# Patient Record
Sex: Male | Born: 2000 | Race: Black or African American | Hispanic: No | Marital: Single | Smoking: Current every day smoker
Health system: Southern US, Community
[De-identification: ages and names within clinical notes are randomized; demographics above are authoritative.]

## PROBLEM LIST (undated history)

## (undated) DIAGNOSIS — I10 Essential (primary) hypertension: Secondary | ICD-10-CM

## (undated) DIAGNOSIS — J45909 Unspecified asthma, uncomplicated: Secondary | ICD-10-CM

## (undated) DIAGNOSIS — S02609A Fracture of mandible, unspecified, initial encounter for closed fracture: Secondary | ICD-10-CM

## (undated) DIAGNOSIS — G43909 Migraine, unspecified, not intractable, without status migrainosus: Secondary | ICD-10-CM

## (undated) HISTORY — DX: Essential (primary) hypertension: I10

## (undated) HISTORY — PX: ABDOMINAL SURGERY: SHX537

## (undated) HISTORY — PX: MANDIBLE SURGERY: SHX707

## (undated) HISTORY — PX: COLOSTOMY: SHX63

---
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
## 2018-11-06 ENCOUNTER — Encounter (HOSPITAL_COMMUNITY): Payer: Self-pay | Admitting: Emergency Medicine

## 2018-11-06 ENCOUNTER — Emergency Department (HOSPITAL_COMMUNITY): Payer: Self-pay

## 2018-11-06 ENCOUNTER — Emergency Department (HOSPITAL_COMMUNITY)
Admission: EM | Admit: 2018-11-06 | Discharge: 2018-11-06 | Discharge status: Against Medical Advice | Payer: Self-pay | Attending: Emergency Medicine | Admitting: Emergency Medicine

## 2018-11-06 ENCOUNTER — Emergency Department (HOSPITAL_COMMUNITY)
Admission: EM | Admit: 2018-11-06 | Discharge: 2018-11-06 | Disposition: A | Discharge status: Home / Self Care | Payer: Self-pay | Attending: Emergency Medicine | Admitting: Emergency Medicine

## 2018-11-06 ENCOUNTER — Other Ambulatory Visit: Payer: Self-pay

## 2018-11-06 ENCOUNTER — Encounter (HOSPITAL_COMMUNITY): Payer: Self-pay

## 2018-11-06 DIAGNOSIS — Y929 Unspecified place or not applicable: Secondary | ICD-10-CM | POA: Insufficient documentation

## 2018-11-06 DIAGNOSIS — Y999 Unspecified external cause status: Secondary | ICD-10-CM | POA: Insufficient documentation

## 2018-11-06 DIAGNOSIS — Y939 Activity, unspecified: Secondary | ICD-10-CM | POA: Insufficient documentation

## 2018-11-06 DIAGNOSIS — Z5321 Procedure and treatment not carried out due to patient leaving prior to being seen by health care provider: Secondary | ICD-10-CM | POA: Insufficient documentation

## 2018-11-06 DIAGNOSIS — S62339A Displaced fracture of neck of unspecified metacarpal bone, initial encounter for closed fracture: Secondary | ICD-10-CM | POA: Insufficient documentation

## 2018-11-06 DIAGNOSIS — F17210 Nicotine dependence, cigarettes, uncomplicated: Secondary | ICD-10-CM | POA: Insufficient documentation

## 2018-11-06 DIAGNOSIS — W2209XA Striking against other stationary object, initial encounter: Secondary | ICD-10-CM | POA: Insufficient documentation

## 2018-11-06 NOTE — Discharge Instructions (Addendum)
Use Tylenol or Motrin as directed for pain 

## 2018-11-06 NOTE — ED Triage Notes (Signed)
Pt reports he was play fighting and punched the wall. Reports rt hand injury. Swelling noted. Pain 6/10. Ice pack applied in triage.

## 2018-11-06 NOTE — ED Triage Notes (Signed)
Patient states he was play boxing and hit a brick wall with his right hand 2 days ago. Patient c/o pain and swelling to the right hand.

## 2018-11-06 NOTE — ED Provider Notes (Signed)
[facility]-EMERGENCY DEPT Provider Note   CSN: [id number] Arrival date & time: 11/06/18  1800    History   Chief Complaint Chief Complaint  Patient presents with  . Hand Injury    HPI [name] [name] is a 18 y.o. male.     18 year old-male complaining of right hand pain after punching a wall 2 days ago.  Pain is localized to the fifth metacarpal.  No distal numbness or tingling to the fifth digit.  Pain is worse with movement better with remaining still.  No treatment use prior to arrival     Past Medical History:  Diagnosis Date  . Migraine     Patient Active Problem List   Diagnosis Date Noted  . 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 COLOSTOMY    . COLOSTOMY    . ORCHIOPEXY Bilateral 05/26/2014   Procedure: Left scrotal exploration for torsion and detorsion;  Surgeon: Judie PetitM. Leonia CoronaShuaib Farooqui, MD;  Location: [facility] OR;  Service: Pediatrics;  Laterality: Bilateral;        Home Medications    Prior to Admission medications   Medication Sig Start Date End Date Taking? Authorizing Provider  HYDROcodone-acetaminophen (NORCO/VICODIN) 5-325 MG per tablet Take 1-1.5 tablets by mouth every 6 (six) hours as needed for moderate pain or severe pain. 05/27/14   Leonia CoronaFarooqui, Shuaib, MD    Family History Family History  Problem Relation Age of Onset  . Diabetes Father     Social History Social History   Tobacco Use  . Smoking status: Current Every Day Smoker    Packs/day: 0.50    Types: Cigarettes  . Smokeless tobacco: Never Used  Substance Use Topics  . Alcohol use: No  . Drug use: No     Allergies   Patient has no known allergies.   Review of Systems Review of Systems  All other systems reviewed and are negative.    Physical Exam Updated Vital Signs BP (!) 142/106 (BP Location: Right Arm)   Pulse 91   Temp 97.9 F (36.6 C) (Oral)   Resp 16   Ht 1.88  m (6\' 2" )   Wt 72.5 kg   SpO2 100%   BMI 20.52 kg/m   Physical Exam Vitals signs and nursing note reviewed.  Constitutional:      Appearance: He is well-developed. He is not toxic-appearing.  HENT:     Head: Normocephalic and atraumatic.  Eyes:     Conjunctiva/sclera: Conjunctivae normal.     Pupils: Pupils are equal, round, and reactive to light.  Neck:     Musculoskeletal: Normal range of motion.  Cardiovascular:     Rate and Rhythm: Normal rate.  Pulmonary:     Effort: Pulmonary effort is normal.  Musculoskeletal:       Hands:  Skin:    General: Skin is warm and dry.  Neurological:     Mental Status: He is alert and oriented to person, place, and time.      ED Treatments / Results  Labs (all labs ordered are listed, but only abnormal results are displayed) Labs Reviewed - No data to display  EKG None  Radiology No results found.  Procedures Procedures (including critical care time)  Medications Ordered in ED Medications - No data to display   Initial Impression / Assessment and Plan / ED Course  I have reviewed the triage vital signs and the nursing notes.  Pertinent labs & imaging results that were available during my care of the patient were reviewed by me and considered in my medical decision making (see chart for details).        With evidence of boxer's fracture on x-ray.  Will place in splint and give him referral  Final Clinical Impressions(s) / ED Diagnoses   Final diagnoses:  None    ED Discharge Orders    None       Lorre Nick, MD 11/06/18 1844

## 2019-10-13 ENCOUNTER — Other Ambulatory Visit: Payer: Self-pay

## 2019-10-13 ENCOUNTER — Emergency Department (HOSPITAL_COMMUNITY)
Admission: EM | Admit: 2019-10-13 | Discharge: 2019-10-13 | Disposition: A | Discharge status: Home / Self Care | Payer: Medicaid Other | Attending: Emergency Medicine | Admitting: Emergency Medicine

## 2019-10-13 ENCOUNTER — Emergency Department (HOSPITAL_COMMUNITY): Payer: Medicaid Other

## 2019-10-13 ENCOUNTER — Encounter (HOSPITAL_COMMUNITY): Payer: Self-pay | Admitting: Emergency Medicine

## 2019-10-13 DIAGNOSIS — T1490XA Injury, unspecified, initial encounter: Secondary | ICD-10-CM

## 2019-10-13 DIAGNOSIS — Z79899 Other long term (current) drug therapy: Secondary | ICD-10-CM | POA: Insufficient documentation

## 2019-10-13 DIAGNOSIS — F17210 Nicotine dependence, cigarettes, uncomplicated: Secondary | ICD-10-CM | POA: Insufficient documentation

## 2019-10-13 DIAGNOSIS — L03119 Cellulitis of unspecified part of limb: Secondary | ICD-10-CM

## 2019-10-13 DIAGNOSIS — L03116 Cellulitis of left lower limb: Secondary | ICD-10-CM | POA: Insufficient documentation

## 2019-10-13 MED ORDER — DOXYCYCLINE HYCLATE 100 MG PO CAPS: 100.0000 mg | ORAL_CAPSULE | Freq: Two times a day (BID) | ORAL | 0 refills | Status: AC

## 2019-10-13 NOTE — ED Triage Notes (Signed)
Patient is complaining of left foot pain on the top of his foot. Patient top of his foot is reddened.

## 2019-10-13 NOTE — Discharge Instructions (Signed)
Take antibiotics as directed. Please take all of your antibiotics until finished.  You can take Tylenol or Ibuprofen as directed for pain. You can alternate Tylenol and Ibuprofen every 4 hours. If you take Tylenol at 1pm, then you can take Ibuprofen at 5pm. Then you can take Tylenol again at 9pm.   As we discussed, if the redness or swelling starts except for adding out of the marked area or you start having fever, worsening pain, inability to walk or swelling starts extending up her leg into her calf, return emergency department for further evaluation.

## 2019-10-13 NOTE — ED Provider Notes (Signed)
[name] DEPT Provider Note   CSN: [id number] Arrival date & time: 10/13/19  1856     History Chief Complaint  Patient presents with  . Foot Injury    [name] [name] is a 19 y.o. male past medical history of migraines who presents for evaluation of 3 days of of redness, swelling noted to the bursal aspect of his left foot.  No preceding trauma, injury.  He states it is gotten slightly more red, swollen.  He has not taken anything for pain.  He is used ice with no improvement in symptoms.  He has not any fevers and denies any numbness/weakness.  He does have history of eczema that affects his feet. He denies any recent immobilization, prior history of DVT/PE, recent surgery, leg swelling, or long travel.  He does report that he smokes cigarettes and marijuana.   The history is provided by the patient.       Past Medical History:  Diagnosis Date  . Migraine     Patient Active Problem List   Diagnosis Date Noted  . 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 COLOSTOMY    . COLOSTOMY    . ORCHIOPEXY Bilateral 05/26/2014   Procedure: Left scrotal exploration for torsion and detorsion;  Surgeon: Jerilynn Mages. Gerald Stabs, MD;  Location: [facility];  Service: Pediatrics;  Laterality: Bilateral;       Family History  Problem Relation Age of Onset  . Diabetes Father     Social History   Tobacco Use  . Smoking status: Current Every Day Smoker    Packs/day: 0.50    Types: Cigarettes  . Smokeless tobacco: Never Used  Substance Use Topics  . Alcohol use: No  . Drug use: No    Home Medications Prior to Admission medications   Medication Sig Start Date End Date Taking? Authorizing Provider  doxycycline (VIBRAMYCIN) 100 MG capsule Take 1 capsule (100 mg total) by mouth 2 (two) times daily for 7 days. 10/13/19 10/20/19  Volanda Napoleon, PA-C  HYDROcodone-acetaminophen  (NORCO/VICODIN) 5-325 MG per tablet Take 1-1.5 tablets by mouth every 6 (six) hours as needed for moderate pain or severe pain. 05/27/14   Gerald Stabs, MD    Allergies    Patient has no known allergies.  Review of Systems   Review of Systems  Constitutional: Negative for fever.  Musculoskeletal:       Foot pain  Skin: Positive for color change.  Neurological: Negative for weakness and numbness.  All other systems reviewed and are negative.   Physical Exam Updated Vital Signs BP 126/85 (BP Location: Left Arm)   Pulse 90   Temp 98.5 F (36.9 C) (Oral)   Resp 18   Ht 6\' 2"  (1.88 m)   Wt 72.6 kg   SpO2 99%   BMI 20.54 kg/m   Physical Exam Vitals and nursing note reviewed.  Constitutional:      Appearance: He is well-developed.  HENT:     Head: Normocephalic and atraumatic.  Eyes:     General: No scleral icterus.       Right eye: No discharge.        Left eye: No discharge.     Conjunctiva/sclera: Conjunctivae normal.  Cardiovascular:     Pulses:          Dorsalis pedis pulses are 2+ on the right side and 2+ on the left side.  Pulmonary:     Effort: Pulmonary effort is normal.  Musculoskeletal:     Comments: Tenderness palpation of dorsal aspect of the left foot.  Flexion/tension of the ankle intact any difficulty.  He can wiggle all 5 digits on difficulty.  No bony tenderness noted to the ankle, tib-fib.  No calf tenderness palpation noted.  Bilateral lower extremities are without edema, erythema.  They are symmetric in appearance.  Skin:    General: Skin is warm and dry.     Capillary Refill: Capillary refill takes less than 2 seconds.     Comments: Good distal cap refill.  LLE is not dusky in appearance or cool to touch.  He has a 7 x 8 cm area of erythema, warmth noted to the dorsal aspect of his foot that does not cross over the ankle joint line.  He has scattered, dry skin noted to the dorsal aspects of both feet consistent with eczema.  Neurological:      Mental Status: He is alert.  Psychiatric:        Speech: Speech normal.        Behavior: Behavior normal.     ED Results / Procedures / Treatments   Labs (all labs ordered are listed, but only abnormal results are displayed) Labs Reviewed - No data to display  EKG None  Radiology DG Foot Complete Left  Result Date: 10/13/2019 CLINICAL DATA:  Pain and redness EXAM: LEFT FOOT - COMPLETE 3+ VIEW COMPARISON:  None. FINDINGS: Frontal, oblique, and lateral views were obtained. No fracture or dislocation. Joint spaces appear normal. No erosive change or bony destruction. IMPRESSION: No fracture or dislocation.  No evident arthropathy. Electronically Signed   By: Bretta Bang III M.D.   On: 10/13/2019 19:41    Procedures Procedures (including critical care time)  Medications Ordered in ED Medications - No data to display  ED Course  I have reviewed the triage vital signs and the nursing notes.  Pertinent labs & imaging results that were available during my care of the patient were reviewed by me and considered in my medical decision making (see chart for details).    MDM Rules/Calculators/A&P                      19 year old male who presents for evaluation of pain, redness, swelling to left foot x3 days.  No fever, injury.  No numbness/weakness.  No DVT risk factors. Patient is afebrile, non-toxic appearing, sitting comfortably on examination table. Vital signs reviewed and stable.  Patient is neurovascularly intact.  On exam, he has a 7 x 8 cm area of erythema, warmth noted dorsal aspect of his foot.  Consider cellulitis.  He does have extensive eczema noted dorsal aspect of foot so question if he was scratching it because a little superficial skin infection.  History/physical exam not concerning for septic arthritis, ischemic limb, DVT.  The area was marked with a skin marker.  X-ray reviewed.  Negative for any acute bony abnormalities.  I discussed results with patient.  We  will plan to start him on antibiotics for cellulitis.  Patient with no known drug allergies.  I did mark the area with a skin marker and told him that if it starts spreading outside of the designated area, he is to return for further evaluation. At this time, patient exhibits no emergent life-threatening condition that require further evaluation in ED or admission. Patient had ample opportunity for questions and discussion. All patient's questions  were answered with full understanding. Strict return precautions discussed. Patient expresses understanding and agreement to plan.   Portions of this note were generated with Scientist, clinical (histocompatibility and immunogenetics). Dictation errors may occur despite best attempts at proofreading.  Final Clinical Impression(s) / ED Diagnoses Final diagnoses:  Injury  Cellulitis of foot    Rx / DC Orders ED Discharge Orders         Ordered    doxycycline (VIBRAMYCIN) 100 MG capsule  2 times daily     10/13/19 1959           Rosana Hoes 10/13/19 2342    Gerhard Munch, MD 10/14/19 [phone]

## 2020-01-08 ENCOUNTER — Emergency Department (HOSPITAL_COMMUNITY): Payer: Self-pay

## 2020-01-08 ENCOUNTER — Encounter (HOSPITAL_COMMUNITY): Payer: Self-pay | Admitting: Emergency Medicine

## 2020-01-08 ENCOUNTER — Other Ambulatory Visit: Payer: Self-pay

## 2020-01-08 ENCOUNTER — Emergency Department (HOSPITAL_COMMUNITY)
Admission: EM | Admit: 2020-01-08 | Discharge: 2020-01-08 | Disposition: A | Discharge status: Home / Self Care | Payer: Self-pay | Attending: Emergency Medicine | Admitting: Emergency Medicine

## 2020-01-08 DIAGNOSIS — W010XXA Fall on same level from slipping, tripping and stumbling without subsequent striking against object, initial encounter: Secondary | ICD-10-CM | POA: Insufficient documentation

## 2020-01-08 DIAGNOSIS — Y9239 Other specified sports and athletic area as the place of occurrence of the external cause: Secondary | ICD-10-CM | POA: Insufficient documentation

## 2020-01-08 DIAGNOSIS — S80212A Abrasion, left knee, initial encounter: Secondary | ICD-10-CM | POA: Insufficient documentation

## 2020-01-08 DIAGNOSIS — Z20822 Contact with and (suspected) exposure to covid-19: Secondary | ICD-10-CM | POA: Insufficient documentation

## 2020-01-08 DIAGNOSIS — Y939 Activity, unspecified: Secondary | ICD-10-CM | POA: Insufficient documentation

## 2020-01-08 DIAGNOSIS — S02609A Fracture of mandible, unspecified, initial encounter for closed fracture: Secondary | ICD-10-CM | POA: Insufficient documentation

## 2020-01-08 DIAGNOSIS — S40212A Abrasion of left shoulder, initial encounter: Secondary | ICD-10-CM | POA: Insufficient documentation

## 2020-01-08 DIAGNOSIS — Z23 Encounter for immunization: Secondary | ICD-10-CM | POA: Insufficient documentation

## 2020-01-08 DIAGNOSIS — F17210 Nicotine dependence, cigarettes, uncomplicated: Secondary | ICD-10-CM | POA: Insufficient documentation

## 2020-01-08 DIAGNOSIS — Y999 Unspecified external cause status: Secondary | ICD-10-CM | POA: Insufficient documentation

## 2020-01-08 LAB — SARS CORONAVIRUS 2 BY RT PCR (HOSPITAL ORDER, PERFORMED IN ~~LOC~~ HOSPITAL LAB): SARS Coronavirus 2: NEGATIVE

## 2020-01-08 LAB — CBC WITH DIFFERENTIAL/PLATELET
Abs Immature Granulocytes: 0.05 10*3/uL (ref 0.00–0.07)
Basophils Absolute: 0.1 10*3/uL (ref 0.0–0.1)
Basophils Relative: 0 %
Eosinophils Absolute: 0 10*3/uL (ref 0.0–0.5)
Eosinophils Relative: 0 %
HCT: 48.9 % (ref 39.0–52.0)
Hemoglobin: 16.5 g/dL (ref 13.0–17.0)
Immature Granulocytes: 0 %
Lymphocytes Relative: 12 %
Lymphs Abs: 1.9 10*3/uL (ref 0.7–4.0)
MCH: 31.7 pg (ref 26.0–34.0)
MCHC: 33.7 g/dL (ref 30.0–36.0)
MCV: 94 fL (ref 80.0–100.0)
Monocytes Absolute: 0.9 10*3/uL (ref 0.1–1.0)
Monocytes Relative: 6 %
Neutro Abs: 13.3 10*3/uL — ABNORMAL HIGH (ref 1.7–7.7)
Neutrophils Relative %: 82 %
Platelets: 262 10*3/uL (ref 150–400)
RBC: 5.2 MIL/uL (ref 4.22–5.81)
RDW: 12.1 % (ref 11.5–15.5)
WBC: 16.3 10*3/uL — ABNORMAL HIGH (ref 4.0–10.5)
nRBC: 0 % (ref 0.0–0.2)

## 2020-01-08 LAB — BASIC METABOLIC PANEL
Anion gap: 10 (ref 5–15)
BUN: 11 mg/dL (ref 6–20)
CO2: 23 mmol/L (ref 22–32)
Calcium: 9.1 mg/dL (ref 8.9–10.3)
Chloride: 104 mmol/L (ref 98–111)
Creatinine, Ser: 0.98 mg/dL (ref 0.61–1.24)
GFR calc Af Amer: >60 mL/min (ref >60)
GFR calc non Af Amer: >60 mL/min (ref >60)
Glucose, Bld: 90 mg/dL (ref 70–99)
Potassium: 4.1 mmol/L (ref 3.5–5.1)
Sodium: 137 mmol/L (ref 135–145)

## 2020-01-08 MED ORDER — HYDROMORPHONE HCL 1 MG/ML IJ SOLN
0.5000 mg | INTRAMUSCULAR | Status: DC | PRN
  Administered 2020-01-08 (×4): 0.5 mg via INTRAVENOUS
  Filled 2020-01-08 (×4): qty 1

## 2020-01-08 MED ORDER — HYDROCODONE-ACETAMINOPHEN 5-325 MG PO TABS: 1.0000 | ORAL_TABLET | Freq: Four times a day (QID) | ORAL | 0 refills | Status: DC | PRN

## 2020-01-08 MED ORDER — AMOXICILLIN 500 MG PO CAPS: 500.0000 mg | ORAL_CAPSULE | Freq: Three times a day (TID) | ORAL | 0 refills | Status: DC

## 2020-01-08 MED ORDER — MORPHINE SULFATE (PF) 4 MG/ML IV SOLN
4.0000 mg | Freq: Once | INTRAVENOUS | Status: AC
  Administered 2020-01-08: 4 mg via INTRAVENOUS
  Filled 2020-01-08: qty 1

## 2020-01-08 MED ORDER — TETANUS-DIPHTH-ACELL PERTUSSIS 5-2.5-18.5 LF-MCG/0.5 IM SUSP
0.5000 mL | Freq: Once | INTRAMUSCULAR | Status: AC
  Administered 2020-01-08: 0.5 mL via INTRAMUSCULAR
  Filled 2020-01-08: qty 0.5

## 2020-01-08 MED ORDER — IBUPROFEN 800 MG PO TABS: 800.0000 mg | ORAL_TABLET | Freq: Three times a day (TID) | ORAL | 0 refills | Status: DC

## 2020-01-08 MED ORDER — ONDANSETRON HCL 4 MG/2ML IJ SOLN
4.0000 mg | Freq: Once | INTRAMUSCULAR | Status: AC
  Administered 2020-01-08: 4 mg via INTRAVENOUS
  Filled 2020-01-08: qty 2

## 2020-01-08 NOTE — ED Notes (Signed)
Patient continues to yell at staff. He is rude and aggressive.
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
## 2020-01-08 NOTE — ED Notes (Signed)
PT left AMA upset at writer while attempting to collect vitals. Writer attempt to give PT ice pack for face PT became upset request pain med. PT state what a ice pack going to do he need pain med. Info PT writer does not give med PT got up walked out of room told Clinical research associate to shut up called Clinical research associate a bitch before walking out triage door.

## 2020-01-08 NOTE — ED Notes (Signed)
S/B  Surgery.
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
## 2020-01-08 NOTE — Discharge Instructions (Signed)
You may eat soft foods like applesauce, jello, yogurt, mashed potatoes, macaroni and cheese. Do not eat any hard foods or foods that you have to chew.  DO NOT SMOKE!!!!!! As this greatly increases your risk for infection! Contact a health care provider if: You have a severe headache. Parts of your face feel numb. You have severe jaw pain that is not relieved with medicine. You have uncontrollable nausea or anxiety. Your swelling or redness gets worse. Get help right away if: You have a fever. You have difficulty breathing. You feel like your airway is tightening. You cannot swallow your saliva. You make a high-pitched whistling sound when you breathe (wheezing).

## 2020-01-14 ENCOUNTER — Encounter (HOSPITAL_COMMUNITY): Payer: Self-pay | Admitting: *Deleted

## 2020-01-14 ENCOUNTER — Other Ambulatory Visit: Payer: Self-pay

## 2020-01-14 NOTE — Progress Notes (Signed)
Pt denies SOB, chest pain, and being under the care of a cardiologist. Pt PCP is Dr. Gerome Sam. Pt denies having a stress test, echo and cardiac cath. Pt denies having a chest x ray and EKG. Pt made aware to stop taking Aspirin (unless otherwise advised by surgeon), vitamins, fish oil and herbal medications. Do not take any NSAIDs ie: Ibuprofen, Advil, Naproxen (Aleve), Motrin, BC and Goody Powder. Nurse instructed pt to be NPO after MN ( see consult note). Pt stated that he called office regarding pain medication refill, NPO and Motrin instructions. Nurse called surgeon's office regarding orders. Surgeon is out of office this week and will return on Monday ( per office staff). Pt reminded to quarantine. Pt verbalized understanding of all pre-op instructions.

## 2020-01-15 ENCOUNTER — Other Ambulatory Visit (HOSPITAL_COMMUNITY): Payer: Medicaid Other

## 2020-01-18 ENCOUNTER — Ambulatory Visit (HOSPITAL_COMMUNITY)
Admission: RE | Admit: 2020-01-18 | Discharge: 2020-01-18 | Disposition: A | Discharge status: Home / Self Care | Payer: Medicaid Other | Attending: Student | Admitting: Student

## 2020-01-18 ENCOUNTER — Encounter (HOSPITAL_COMMUNITY)
Admission: RE | Disposition: A | Discharge status: Home / Self Care | Payer: Self-pay | Source: Home / Self Care | Attending: Student

## 2020-01-18 ENCOUNTER — Encounter (HOSPITAL_COMMUNITY): Payer: Self-pay

## 2020-01-18 ENCOUNTER — Ambulatory Visit (HOSPITAL_COMMUNITY): Payer: Medicaid Other | Admitting: Anesthesiology

## 2020-01-18 ENCOUNTER — Ambulatory Visit: Payer: Self-pay | Admitting: Student

## 2020-01-18 ENCOUNTER — Other Ambulatory Visit: Payer: Self-pay

## 2020-01-18 DIAGNOSIS — R519 Headache, unspecified: Secondary | ICD-10-CM | POA: Insufficient documentation

## 2020-01-18 DIAGNOSIS — S0269XA Fracture of mandible of other specified site, initial encounter for closed fracture: Secondary | ICD-10-CM | POA: Insufficient documentation

## 2020-01-18 DIAGNOSIS — F17210 Nicotine dependence, cigarettes, uncomplicated: Secondary | ICD-10-CM | POA: Insufficient documentation

## 2020-01-18 DIAGNOSIS — Y939 Activity, unspecified: Secondary | ICD-10-CM | POA: Insufficient documentation

## 2020-01-18 DIAGNOSIS — Z833 Family history of diabetes mellitus: Secondary | ICD-10-CM | POA: Insufficient documentation

## 2020-01-18 DIAGNOSIS — K011 Impacted teeth: Secondary | ICD-10-CM | POA: Insufficient documentation

## 2020-01-18 DIAGNOSIS — S02652A Fracture of angle of left mandible, initial encounter for closed fracture: Secondary | ICD-10-CM | POA: Insufficient documentation

## 2020-01-18 DIAGNOSIS — Z20822 Contact with and (suspected) exposure to covid-19: Secondary | ICD-10-CM | POA: Insufficient documentation

## 2020-01-18 HISTORY — DX: Fracture of mandible, unspecified, initial encounter for closed fracture: S02.609A

## 2020-01-18 HISTORY — PX: ORIF MANDIBULAR FRACTURE: SHX2127

## 2020-01-18 HISTORY — DX: Unspecified asthma, uncomplicated: J45.909

## 2020-01-18 LAB — SARS CORONAVIRUS 2 BY RT PCR (HOSPITAL ORDER, PERFORMED IN ~~LOC~~ HOSPITAL LAB): SARS Coronavirus 2: NEGATIVE

## 2020-01-18 SURGERY — OPEN REDUCTION INTERNAL FIXATION (ORIF) MANDIBULAR FRACTURE
Anesthesia: General | Laterality: Bilateral

## 2020-01-18 MED ORDER — ONDANSETRON HCL 4 MG/2ML IJ SOLN
INTRAMUSCULAR | Status: AC
  Filled 2020-01-18: qty 2

## 2020-01-18 MED ORDER — FENTANYL CITRATE (PF) 250 MCG/5ML IJ SOLN
INTRAMUSCULAR | Status: AC
  Filled 2020-01-18: qty 5

## 2020-01-18 MED ORDER — SUCCINYLCHOLINE CHLORIDE 200 MG/10ML IV SOSY
PREFILLED_SYRINGE | INTRAVENOUS | Status: AC
  Filled 2020-01-18: qty 10

## 2020-01-18 MED ORDER — LIDOCAINE 2% (20 MG/ML) 5 ML SYRINGE
INTRAMUSCULAR | Status: AC
  Filled 2020-01-18: qty 5

## 2020-01-18 MED ORDER — ALBUMIN HUMAN 5 % IV SOLN
INTRAVENOUS | Status: DC | PRN
Start: 2020-01-18 — End: 2020-01-18

## 2020-01-18 MED ORDER — ROCURONIUM BROMIDE 10 MG/ML (PF) SYRINGE
PREFILLED_SYRINGE | INTRAVENOUS | Status: DC | PRN
  Administered 2020-01-18: 70 mg via INTRAVENOUS

## 2020-01-18 MED ORDER — HYDROMORPHONE HCL 1 MG/ML IJ SOLN
0.2500 mg | INTRAMUSCULAR | Status: DC | PRN
  Administered 2020-01-18 (×2): 0.5 mg via INTRAVENOUS

## 2020-01-18 MED ORDER — SODIUM CHLORIDE 0.9 % IV SOLN
3.0000 g | Freq: Once | INTRAVENOUS | Status: AC
  Administered 2020-01-18: 3 g via INTRAVENOUS
  Filled 2020-01-18: qty 0.15

## 2020-01-18 MED ORDER — HYDRALAZINE HCL 20 MG/ML IJ SOLN
INTRAMUSCULAR | Status: AC
  Filled 2020-01-18: qty 1

## 2020-01-18 MED ORDER — ACETAMINOPHEN 10 MG/ML IV SOLN
INTRAVENOUS | Status: DC | PRN
Start: 2020-01-18 — End: 2020-01-18
  Administered 2020-01-18: 1000 mg via INTRAVENOUS

## 2020-01-18 MED ORDER — LIDOCAINE 2% (20 MG/ML) 5 ML SYRINGE
INTRAMUSCULAR | Status: DC | PRN
  Administered 2020-01-18: 100 mg via INTRAVENOUS

## 2020-01-18 MED ORDER — EPHEDRINE 5 MG/ML INJ
INTRAVENOUS | Status: AC
  Filled 2020-01-18: qty 10

## 2020-01-18 MED ORDER — FENTANYL CITRATE (PF) 250 MCG/5ML IJ SOLN
INTRAMUSCULAR | Status: DC | PRN
  Administered 2020-01-18 (×5): 50 ug via INTRAVENOUS

## 2020-01-18 MED ORDER — OXYMETAZOLINE HCL 0.05 % NA SOLN
NASAL | Status: AC
  Filled 2020-01-18: qty 30

## 2020-01-18 MED ORDER — CHLORHEXIDINE GLUCONATE 0.12 % MT SOLN
15.0000 mL | Freq: Two times a day (BID) | OROMUCOSAL | 0 refills | Status: DC
Start: 2020-01-18 — End: 2023-11-15

## 2020-01-18 MED ORDER — HYDROCODONE-ACETAMINOPHEN 5-325 MG PO TABS: 1.0000 | ORAL_TABLET | ORAL | 0 refills | Status: AC | PRN

## 2020-01-18 MED ORDER — PROMETHAZINE HCL 25 MG/ML IJ SOLN: 6.2500 mg | INTRAMUSCULAR | Status: DC | PRN

## 2020-01-18 MED ORDER — KETAMINE HCL 10 MG/ML IJ SOLN
INTRAMUSCULAR | Status: DC | PRN
Start: 2020-01-18 — End: 2020-01-18
  Administered 2020-01-18: 20 mg via INTRAVENOUS

## 2020-01-18 MED ORDER — KETAMINE HCL 50 MG/5ML IJ SOSY
PREFILLED_SYRINGE | INTRAMUSCULAR | Status: AC
  Filled 2020-01-18: qty 5

## 2020-01-18 MED ORDER — LACTATED RINGERS IV SOLN: INTRAVENOUS | Status: DC

## 2020-01-18 MED ORDER — HYDROMORPHONE HCL 1 MG/ML IJ SOLN
INTRAMUSCULAR | Status: AC
  Filled 2020-01-18: qty 1

## 2020-01-18 MED ORDER — OXYMETAZOLINE HCL 0.05 % NA SOLN
NASAL | Status: DC | PRN
  Administered 2020-01-18: 3 via NASAL

## 2020-01-18 MED ORDER — LIDOCAINE-EPINEPHRINE 2 %-1:100000 IJ SOLN
INTRAMUSCULAR | Status: AC
  Filled 2020-01-18: qty 1

## 2020-01-18 MED ORDER — PROPOFOL 10 MG/ML IV BOLUS
INTRAVENOUS | Status: AC
  Filled 2020-01-18: qty 20

## 2020-01-18 MED ORDER — ACETAMINOPHEN 10 MG/ML IV SOLN
INTRAVENOUS | Status: AC
  Filled 2020-01-18: qty 100

## 2020-01-18 MED ORDER — HYDROCODONE-ACETAMINOPHEN 5-325 MG PO TABS: 1.0000 | ORAL_TABLET | Freq: Four times a day (QID) | ORAL | 0 refills | Status: DC | PRN

## 2020-01-18 MED ORDER — ORAL CARE MOUTH RINSE: 15.0000 mL | Freq: Once | OROMUCOSAL | Status: AC

## 2020-01-18 MED ORDER — MIDAZOLAM HCL 2 MG/2ML IJ SOLN
INTRAMUSCULAR | Status: AC
  Filled 2020-01-18: qty 2

## 2020-01-18 MED ORDER — AMOXICILLIN 500 MG PO CAPS
500.0000 mg | ORAL_CAPSULE | Freq: Three times a day (TID) | ORAL | 0 refills | Status: DC
Start: 2020-01-18 — End: 2023-11-15

## 2020-01-18 MED ORDER — KETOROLAC TROMETHAMINE 30 MG/ML IJ SOLN: 30.0000 mg | Freq: Once | INTRAMUSCULAR | Status: DC | PRN

## 2020-01-18 MED ORDER — EPHEDRINE SULFATE 50 MG/ML IJ SOLN
INTRAMUSCULAR | Status: DC | PRN
  Administered 2020-01-18: 2.5 mg via INTRAVENOUS
  Administered 2020-01-18 (×2): 5 mg via INTRAVENOUS

## 2020-01-18 MED ORDER — PROPOFOL 10 MG/ML IV BOLUS
INTRAVENOUS | Status: DC | PRN
  Administered 2020-01-18: 200 mg via INTRAVENOUS

## 2020-01-18 MED ORDER — SUGAMMADEX SODIUM 200 MG/2ML IV SOLN
INTRAVENOUS | Status: DC | PRN
  Administered 2020-01-18: 200 mg via INTRAVENOUS

## 2020-01-18 MED ORDER — ROCURONIUM BROMIDE 10 MG/ML (PF) SYRINGE
PREFILLED_SYRINGE | INTRAVENOUS | Status: AC
  Filled 2020-01-18: qty 20

## 2020-01-18 MED ORDER — MEPERIDINE HCL 25 MG/ML IJ SOLN: 6.2500 mg | INTRAMUSCULAR | Status: DC | PRN

## 2020-01-18 MED ORDER — EPINEPHRINE 1 MG/10ML IJ SOSY
PREFILLED_SYRINGE | INTRAMUSCULAR | Status: AC
  Filled 2020-01-18: qty 10

## 2020-01-18 MED ORDER — ONDANSETRON HCL 4 MG/2ML IJ SOLN
INTRAMUSCULAR | Status: DC | PRN
  Administered 2020-01-18: 4 mg via INTRAVENOUS

## 2020-01-18 MED ORDER — PHENYLEPHRINE 40 MCG/ML (10ML) SYRINGE FOR IV PUSH (FOR BLOOD PRESSURE SUPPORT)
PREFILLED_SYRINGE | INTRAVENOUS | Status: AC
  Filled 2020-01-18: qty 10

## 2020-01-18 MED ORDER — MIDAZOLAM HCL 5 MG/5ML IJ SOLN
INTRAMUSCULAR | Status: DC | PRN
  Administered 2020-01-18: 2 mg via INTRAVENOUS

## 2020-01-18 MED ORDER — HYDRALAZINE HCL 20 MG/ML IJ SOLN
5.0000 mg | INTRAMUSCULAR | Status: DC | PRN
  Administered 2020-01-18: 5 mg via INTRAVENOUS

## 2020-01-18 MED ORDER — LIDOCAINE-EPINEPHRINE 2 %-1:100000 IJ SOLN
INTRAMUSCULAR | Status: DC | PRN
  Administered 2020-01-18: 26 mL via INTRADERMAL

## 2020-01-18 MED ORDER — DEXMEDETOMIDINE HCL 200 MCG/2ML IV SOLN
INTRAVENOUS | Status: DC | PRN
  Administered 2020-01-18: 12 ug via INTRAVENOUS
  Administered 2020-01-18: 8 ug via INTRAVENOUS

## 2020-01-18 MED ORDER — CHLORHEXIDINE GLUCONATE 0.12 % MT SOLN
15.0000 mL | Freq: Once | OROMUCOSAL | Status: AC
  Administered 2020-01-18: 15 mL via OROMUCOSAL
  Filled 2020-01-18: qty 15

## 2020-01-18 MED ORDER — BSS IO SOLN
INTRAOCULAR | Status: AC
  Filled 2020-01-18: qty 15

## 2020-01-18 MED ORDER — DEXAMETHASONE SODIUM PHOSPHATE 10 MG/ML IJ SOLN
INTRAMUSCULAR | Status: DC | PRN
  Administered 2020-01-18: 10 mg via INTRAVENOUS

## 2020-01-18 SURGICAL SUPPLY — 68 items
APL SKNCLS STERI-STRIP NONHPOA (GAUZE/BANDAGES/DRESSINGS) ×2
BENZOIN TINCTURE PRP APPL 2/3 (GAUZE/BANDAGES/DRESSINGS) ×4 IMPLANT
BIT DRILL RAINBOW 1.6X35 (BIT) ×2 IMPLANT
BLADE CLIPPER SURG (BLADE) IMPLANT
BLADE SURG 15 STRL LF DISP TIS (BLADE) IMPLANT
BLADE SURG 15 STRL SS (BLADE)
BUR CROSS CUT FISSURE 1.6 (BURR) ×1 IMPLANT
BUR CROSS CUT FISSURE 1.6MM (BURR) ×1
CANISTER SUCT 3000ML PPV (MISCELLANEOUS) ×3 IMPLANT
CLEANER TIP ELECTROSURG 2X2 (MISCELLANEOUS) IMPLANT
COVER SURGICAL LIGHT HANDLE (MISCELLANEOUS) ×3 IMPLANT
COVER WAND RF STERILE (DRAPES) ×3 IMPLANT
DECANTER SPIKE VIAL GLASS SM (MISCELLANEOUS) ×3 IMPLANT
DRAPE HALF SHEET 40X57 (DRAPES) IMPLANT
ELECT COATED BLADE 2.86 ST (ELECTRODE) ×3 IMPLANT
ELECT NDL BLADE 2-5/6 (NEEDLE) ×1 IMPLANT
ELECT NEEDLE BLADE 2-5/6 (NEEDLE) ×3 IMPLANT
ELECT REM PT RETURN 9FT ADLT (ELECTROSURGICAL) ×3
ELECTRODE REM PT RTRN 9FT ADLT (ELECTROSURGICAL) ×1 IMPLANT
GAUZE PACKING FOLDED 2  STR (GAUZE/BANDAGES/DRESSINGS) ×3
GAUZE PACKING FOLDED 2 STR (GAUZE/BANDAGES/DRESSINGS) IMPLANT
GLOVE BIO SURGEON STRL SZ7.5 (GLOVE) ×3 IMPLANT
GLOVE BIOGEL PI IND STRL 8 (GLOVE) ×1 IMPLANT
GLOVE BIOGEL PI INDICATOR 8 (GLOVE) ×2
GOWN STRL REUS W/ TWL LRG LVL3 (GOWN DISPOSABLE) ×2 IMPLANT
GOWN STRL REUS W/TWL LRG LVL3 (GOWN DISPOSABLE) ×6
KIT BASIN OR (CUSTOM PROCEDURE TRAY) ×3 IMPLANT
KIT TURNOVER KIT B (KITS) ×3 IMPLANT
NDL HYPO 25GX1X1/2 BEV (NEEDLE) IMPLANT
NDL PRECISIONGLIDE 27X1.5 (NEEDLE) ×1 IMPLANT
NDL SPNL 25GX3.5 QUINCKE BL (NEEDLE) IMPLANT
NEEDLE HYPO 25GX1X1/2 BEV (NEEDLE) ×3 IMPLANT
NEEDLE PRECISIONGLIDE 27X1.5 (NEEDLE) ×3 IMPLANT
NEEDLE SPNL 25GX3.5 QUINCKE BL (NEEDLE) ×3 IMPLANT
NS IRRIG 1000ML POUR BTL (IV SOLUTION) ×3 IMPLANT
PAD ARMBOARD 7.5X6 YLW CONV (MISCELLANEOUS) ×6 IMPLANT
PENCIL BUTTON HOLSTER BLD 10FT (ELECTRODE) ×3 IMPLANT
PLATE 4 H FRACTURE C SHAPE (Plate) ×2 IMPLANT
PLATE 4 H MINI W/BAR (Plate) ×2 IMPLANT
PLATE MNDBLE MINI 4H (Plate) ×2 IMPLANT
POSITIONER HEAD DONUT 9IN (MISCELLANEOUS) IMPLANT
SCISSORS WIRE ANG 4 3/4 DISP (INSTRUMENTS) ×3 IMPLANT
SCREW 2.0X12MM (Screw) ×2 IMPLANT
SCREW BONE CROSS PIN 2.0X05MM (Screw) ×8 IMPLANT
SCREW BONE CROSS PIN 2.0X10MM (Screw) ×10 IMPLANT
SCREW BONE CROSS PIN 2.0X12MM (Screw) ×4 IMPLANT
SCREW LOCKING 2.3X10MM (Screw) ×2 IMPLANT
SCREW UPPER FACE 2.0X12MM (Screw) ×2 IMPLANT
SCREW UPPER FACE 2.0X8MM (Screw) ×4 IMPLANT
STAPLER VISISTAT (STAPLE) ×3 IMPLANT
SUT CHROMIC 3 0 PS 2 (SUTURE) ×2 IMPLANT
SUT ETHILON 4 0 CL P 3 (SUTURE) IMPLANT
SUT MON AB 3-0 SH 27 (SUTURE)
SUT MON AB 3-0 SH27 (SUTURE) IMPLANT
SUT PLAIN 5 0 P 3 18 (SUTURE) ×2 IMPLANT
SUT PROLENE 6 0 PC 1 (SUTURE) IMPLANT
SUT STEEL 0 (SUTURE)
SUT STEEL 0 18XMFL TIE 17 (SUTURE) IMPLANT
SUT STEEL 1 (SUTURE) IMPLANT
SUT STEEL 2 (SUTURE) IMPLANT
SUT STEEL 4 (SUTURE) IMPLANT
SUT VIC AB 4-0 PS2 27 (SUTURE) ×2 IMPLANT
SUT VICRYL 4-0 PS2 18IN ABS (SUTURE) IMPLANT
TAPE CLOTH SOFT 2X10 (GAUZE/BANDAGES/DRESSINGS) ×2 IMPLANT
TOWEL GREEN STERILE FF (TOWEL DISPOSABLE) ×3 IMPLANT
TRAY ENT MC OR (CUSTOM PROCEDURE TRAY) ×3 IMPLANT
TRAY FOLEY MTR SLVR 14FR STAT (SET/KITS/TRAYS/PACK) IMPLANT
WATER STERILE IRR 1000ML POUR (IV SOLUTION) ×3 IMPLANT

## 2020-01-18 NOTE — H&P (Signed)
H&P update: History reviewed with patient who denies changes since his consultation on 01/08/2020. He appropriate for ORIF of bilateral mandible fractures as planned during his consultation. See formal H&P/Consult on 01/08/2020 for further details and exam findings. Marland Kitchen

## 2020-01-18 NOTE — Transfer of Care (Signed)
Immediate Anesthesia Transfer of Care Note  Patient: [name] [name]  Procedure(s) Performed: OPEN REDUCTION INTERNAL FIXATION (ORIF) BILATERAL MANDIBULAR FRACTURES (Bilateral )  Patient Location: PACU  Anesthesia Type:General  Level of Consciousness: awake, alert  and oriented  Airway & Oxygen Therapy: Patient Spontanous Breathing and Patient connected to face mask oxygen  Post-op Assessment: Report given to RN and Post -op Vital signs reviewed and stable  Post vital signs: Reviewed and stable  Last Vitals:  Vitals Value Taken Time  BP 143/97 01/18/20 2120  Temp    Pulse 87 01/18/20 2121  Resp 18 01/18/20 2121  SpO2 96 % 01/18/20 2121  Vitals shown include unvalidated device data.  Last Pain:  Vitals:   01/18/20 1613  TempSrc:   PainSc: 0-No pain      Patients Stated Pain Goal: 4 (01/18/20 1613)  Complications: No complications documented.

## 2020-01-18 NOTE — Anesthesia Preprocedure Evaluation (Addendum)
Anesthesia Evaluation  Patient identified by MRN, date of birth, ID band Patient awake    Reviewed: Allergy & Precautions, NPO status , Patient's Chart, lab work & pertinent test results  Airway      Mouth opening: Limited Mouth Opening  Dental   Pulmonary Current Smoker,    Pulmonary exam normal breath sounds clear to auscultation       Cardiovascular negative cardio ROS Normal cardiovascular exam Rhythm:Regular Rate:Normal     Neuro/Psych  Headaches, negative psych ROS   GI/Hepatic negative GI ROS, Neg liver ROS,   Endo/Other  negative endocrine ROS  Renal/GU negative Renal ROS     Musculoskeletal   Abdominal Normal abdominal exam  (+)   Peds  Hematology negative hematology ROS (+)   Anesthesia Other Findings   Reproductive/Obstetrics                             Anesthesia Physical Anesthesia Plan  ASA: I  Anesthesia Plan: General   Post-op Pain Management:    Induction: Intravenous  PONV Risk Score and Plan: 2 and Ondansetron, Dexamethasone and Midazolam  Airway Management Planned: Nasal ETT  Additional Equipment: None  Intra-op Plan:   Post-operative Plan:   Informed Consent: I have reviewed the patients History and Physical, chart, labs and discussed the procedure including the risks, benefits and alternatives for the proposed anesthesia with the patient or authorized representative who has indicated his/her understanding and acceptance.     Dental advisory given  Plan Discussed with: CRNA  Anesthesia Plan Comments:         Anesthesia Quick Evaluation

## 2020-01-18 NOTE — Anesthesia Procedure Notes (Signed)
Procedure Name: Intubation Performed by: Ezekiel Ina, CRNA Pre-anesthesia Checklist: Patient identified, Emergency Drugs available, Suction available and Patient being monitored Patient Re-evaluated:Patient Re-evaluated prior to induction Oxygen Delivery Method: Circle System Utilized Preoxygenation: Pre-oxygenation with 100% oxygen Induction Type: IV induction Ventilation: Mask ventilation without difficulty Laryngoscope Size: Glidescope and 4 Grade View: Grade I Tube type: Oral Nasal Tubes: Nasal Rae, Nasal prep performed and Right Tube size: 7.5 mm Number of attempts: 1 Airway Equipment and Method: Stylet and Oral airway Placement Confirmation: ETT inserted through vocal cords under direct vision,  positive ETCO2 and breath sounds checked- equal and bilateral Secured at: 29 cm Tube secured with: Tape Dental Injury: Teeth and Oropharynx as per pre-operative assessment

## 2020-01-18 NOTE — Brief Op Note (Signed)
01/18/2020  9:38 PM  PATIENT:  [name] [name]  18 y.o. male  PRE-OPERATIVE DIAGNOSIS:  BILATERAL MANDIBLE FRACTURES  POST-OPERATIVE DIAGNOSIS:  BILATERAL MANDIBLE FRACTURES  PROCEDURE:  Procedure(s): OPEN REDUCTION INTERNAL FIXATION (ORIF) BILATERAL MANDIBULAR FRACTURES (Bilateral)  SURGEON:  Surgeon(s) and Role:    * Lovena Neighbours, MD - Primary  ANESTHESIA:   general  EBL:  20 mL   BLOOD ADMINISTERED:none  DRAINS: none   LOCAL MEDICATIONS USED:  LIDOCAINE   SPECIMEN:  No Specimen  DISPOSITION OF SPECIMEN:  N/A  COUNTS:  YES  DICTATION: .Note written in EPIC  PLAN OF CARE: Discharge to home after PACU  PATIENT DISPOSITION:  PACU - hemodynamically stable.   Delay start of Pharmacological VTE agent (>24hrs) due to surgical blood loss or risk of bleeding: not applicable

## 2020-01-19 ENCOUNTER — Encounter (HOSPITAL_COMMUNITY): Payer: Self-pay | Admitting: Student

## 2020-01-19 MED ORDER — GLYCOPYRROLATE PF 0.2 MG/ML IJ SOSY
PREFILLED_SYRINGE | INTRAMUSCULAR | Status: DC | PRN
  Administered 2020-01-18: .2 mg via INTRAVENOUS

## 2020-01-19 NOTE — Anesthesia Postprocedure Evaluation (Signed)
Anesthesia Post Note  Patient: [name] [name]  Procedure(s) Performed: OPEN REDUCTION INTERNAL FIXATION (ORIF) BILATERAL MANDIBULAR FRACTURES (Bilateral )     Patient location during evaluation: PACU Anesthesia Type: General Level of consciousness: awake and alert Pain management: pain level controlled Vital Signs Assessment: post-procedure vital signs reviewed and stable Respiratory status: spontaneous breathing, nonlabored ventilation, respiratory function stable and patient connected to nasal cannula oxygen Cardiovascular status: blood pressure returned to baseline and stable Postop Assessment: no apparent nausea or vomiting Anesthetic complications: no   No complications documented.  Last Vitals:  Vitals:   01/18/20 2245 01/18/20 2300  BP: (!) 151/99 (!) 146/95  Pulse: (!) 57 (!) 55  Resp: 14 15  Temp:  36.8 C  SpO2: 100% 100%    Last Pain:  Vitals:   01/18/20 2315  TempSrc:   PainSc: 5                  Shelton Silvas
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
## 2020-02-19 ENCOUNTER — Encounter (HOSPITAL_COMMUNITY): Payer: Self-pay | Admitting: Emergency Medicine

## 2020-02-19 ENCOUNTER — Emergency Department (HOSPITAL_COMMUNITY)
Admission: EM | Admit: 2020-02-19 | Discharge: 2020-02-19 | Disposition: A | Discharge status: Home / Self Care | Payer: HRSA Program | Attending: Emergency Medicine | Admitting: Emergency Medicine

## 2020-02-19 DIAGNOSIS — R430 Anosmia: Secondary | ICD-10-CM | POA: Diagnosis present

## 2020-02-19 DIAGNOSIS — U071 COVID-19: Secondary | ICD-10-CM | POA: Diagnosis not present

## 2020-02-19 DIAGNOSIS — Z20822 Contact with and (suspected) exposure to covid-19: Secondary | ICD-10-CM

## 2020-02-19 DIAGNOSIS — F17210 Nicotine dependence, cigarettes, uncomplicated: Secondary | ICD-10-CM | POA: Diagnosis not present

## 2020-02-19 DIAGNOSIS — Z202 Contact with and (suspected) exposure to infections with a predominantly sexual mode of transmission: Secondary | ICD-10-CM | POA: Insufficient documentation

## 2020-02-19 DIAGNOSIS — Z113 Encounter for screening for infections with a predominantly sexual mode of transmission: Secondary | ICD-10-CM

## 2020-02-19 LAB — URINALYSIS, ROUTINE W REFLEX MICROSCOPIC
Bacteria, UA: NONE SEEN
Bilirubin Urine: NEGATIVE
Glucose, UA: NEGATIVE mg/dL
Hgb urine dipstick: NEGATIVE
Ketones, ur: 5 mg/dL — AB
Leukocytes,Ua: NEGATIVE
Nitrite: NEGATIVE
Protein, ur: 30 mg/dL — AB
Specific Gravity, Urine: 1.028 (ref 1.005–1.030)
pH: 5 (ref 5.0–8.0)

## 2020-02-19 LAB — HIV ANTIBODY (ROUTINE TESTING W REFLEX): HIV Screen 4th Generation wRfx: NONREACTIVE

## 2020-02-19 LAB — SARS CORONAVIRUS 2 (TAT 6-24 HRS): SARS Coronavirus 2: POSITIVE — AB
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
## 2020-02-19 NOTE — ED Triage Notes (Signed)
Pt states loss of taste/smell x1-2. Denies any other symptoms. Denies any recent covid exposure.

## 2020-02-19 NOTE — Discharge Instructions (Signed)
Please quarantine until you get the results of you COVID test which could take up to 24 hours Your loss of smell and taste will eventually return without any treatment You have been tested for STDs today as well. If your test is abnormal, you will be called. You can also review your results on MyChart and they should be available in the next 2-3 days Please establish care with a primary care doctor for a full physical

## 2020-02-19 NOTE — ED Notes (Signed)
Patient verbalizes understanding of discharge instructions. Opportunity for questioning and answers were provided. Armband removed by staff, pt discharged from ED ambulatory.   

## 2020-02-20 ENCOUNTER — Telehealth (HOSPITAL_COMMUNITY): Payer: Self-pay

## 2020-02-20 LAB — RPR: RPR Ser Ql: NONREACTIVE
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
## 2022-01-04 ENCOUNTER — Ambulatory Visit (INDEPENDENT_AMBULATORY_CARE_PROVIDER_SITE_OTHER): Payer: 59 | Admitting: Internal Medicine

## 2022-01-04 ENCOUNTER — Other Ambulatory Visit: Payer: Self-pay

## 2022-01-04 ENCOUNTER — Encounter: Payer: Self-pay | Admitting: Internal Medicine

## 2022-01-04 ENCOUNTER — Other Ambulatory Visit (HOSPITAL_COMMUNITY)
Admission: RE | Admit: 2022-01-04 | Discharge: 2022-01-04 | Disposition: A | Discharge status: Home / Self Care | Payer: 59 | Source: Ambulatory Visit | Attending: Internal Medicine | Admitting: Internal Medicine

## 2022-01-04 VITALS — BP 121/83 | HR 68 | Temp 98.4°F | Resp 24 | Ht 74.0 in | Wt 201.2 lb

## 2022-01-04 DIAGNOSIS — I10 Essential (primary) hypertension: Secondary | ICD-10-CM | POA: Diagnosis not present

## 2022-01-04 DIAGNOSIS — Z9189 Other specified personal risk factors, not elsewhere classified: Secondary | ICD-10-CM

## 2022-01-04 DIAGNOSIS — F17210 Nicotine dependence, cigarettes, uncomplicated: Secondary | ICD-10-CM

## 2022-01-04 DIAGNOSIS — Z7689 Persons encountering health services in other specified circumstances: Secondary | ICD-10-CM | POA: Diagnosis not present
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
## 2022-01-04 NOTE — Patient Instructions (Signed)
[name], it was a pleasure seeing you today! You endorsed feeling well today. Below are some of the things we talked about this visit. We look forward to seeing you in the follow up appointment!  Today we discussed: You presented for a follow up. You wanted some lab testing to be performed but did not have any acute concerns.  We will perform the labs.  Your blood pressure was elevated but will not start any medication right now.  We will have a follow-up in 6 months and if your blood pressure continues to be elevated then we will consider starting a medication.  Work on the dietary changes that are recommended including loss salt intake, increasing exercise, and stopping smoking.   I have ordered the following labs today:   Lab Orders         RPR         HepB+HepC+HIV Panel       Referrals ordered today:   Referral Orders  No referral(s) requested today     I have ordered the following medication/changed the following medications:   Stop the following medications: There are no discontinued medications.   Start the following medications: No orders of the defined types were placed in this encounter.    Follow-up: 6 month follow up  Please make sure to arrive 15 minutes prior to your next appointment. If you arrive late, you may be asked to reschedule.   We look forward to seeing you next time. Please call our clinic at [phone] if you have any questions or concerns. The best time to call is Monday-Friday from 9am-4pm, but there is someone available 24/7. If after hours or the weekend, call the main hospital number and ask for the Internal Medicine Resident On-Call. If you need medication refills, please notify your pharmacy one week in advance and they will send [place] a request.  Thank you for letting us take part in your care. Wishing you the best!  Thank you, Gwenevere Abbot, MD

## 2022-01-05 ENCOUNTER — Encounter: Payer: Self-pay | Admitting: Internal Medicine

## 2022-01-05 DIAGNOSIS — I10 Essential (primary) hypertension: Secondary | ICD-10-CM | POA: Insufficient documentation

## 2022-01-05 DIAGNOSIS — Z9189 Other specified personal risk factors, not elsewhere classified: Secondary | ICD-10-CM | POA: Insufficient documentation

## 2022-01-05 LAB — HEPB+HEPC+HIV PANEL
HIV Screen 4th Generation wRfx: NONREACTIVE
Hep B C IgM: NEGATIVE
Hep B Core Total Ab: NEGATIVE
Hep B E Ab: NEGATIVE
Hep B E Ag: NEGATIVE
Hep B Surface Ab, Qual: REACTIVE
Hep C Virus Ab: NONREACTIVE
Hepatitis B Surface Ag: NEGATIVE

## 2022-01-05 LAB — URINE CYTOLOGY ANCILLARY ONLY
Chlamydia: NEGATIVE
Comment: NEGATIVE
Comment: NORMAL
Neisseria Gonorrhea: NEGATIVE

## 2022-01-05 LAB — RPR: RPR Ser Ql: NONREACTIVE

## 2022-01-05 NOTE — Assessment & Plan Note (Signed)
Patient was told his blood pressure was elevated and he was started on HCTZ. He stated he did not take that. His blood pressure was 121/93. He did not want to start any medications. I encouraged him to reduce salt intake and exercise for at least 150 mins per week. If his blood pressure is elevated at follow up then we will discuss initiating anti-hypertensive.  -Limit salt intake, and exercise 150 min per week.

## 2022-01-05 NOTE — Assessment & Plan Note (Signed)
Hep B, Hep C and HIV, and RPR and urine screen for chlamydia, and gonorrhea. -All results were negative.

## 2022-01-09 NOTE — Progress Notes (Signed)
Internal Medicine Clinic Attending  Case discussed with Dr. Khan  At the time of the visit.  We reviewed the resident's history and exam and pertinent patient test results.  I agree with the assessment, diagnosis, and plan of care documented in the resident's note.  

## 2022-01-23 ENCOUNTER — Emergency Department (HOSPITAL_COMMUNITY)
Admission: EM | Admit: 2022-01-23 | Discharge: 2022-01-23 | Disposition: A | Discharge status: Home / Self Care | Payer: Self-pay | Attending: Emergency Medicine | Admitting: Emergency Medicine

## 2022-01-23 ENCOUNTER — Other Ambulatory Visit: Payer: Self-pay

## 2022-01-23 ENCOUNTER — Emergency Department (HOSPITAL_COMMUNITY): Payer: Self-pay

## 2022-01-23 ENCOUNTER — Encounter (HOSPITAL_COMMUNITY): Payer: Self-pay | Admitting: Emergency Medicine

## 2022-01-23 DIAGNOSIS — Y9339 Activity, other involving climbing, rappelling and jumping off: Secondary | ICD-10-CM | POA: Insufficient documentation

## 2022-01-23 DIAGNOSIS — A64 Unspecified sexually transmitted disease: Secondary | ICD-10-CM | POA: Insufficient documentation

## 2022-01-23 DIAGNOSIS — W1789XA Other fall from one level to another, initial encounter: Secondary | ICD-10-CM | POA: Insufficient documentation

## 2022-01-23 DIAGNOSIS — S82141A Displaced bicondylar fracture of right tibia, initial encounter for closed fracture: Secondary | ICD-10-CM

## 2022-01-23 DIAGNOSIS — S82201A Unspecified fracture of shaft of right tibia, initial encounter for closed fracture: Secondary | ICD-10-CM | POA: Insufficient documentation

## 2022-01-23 DIAGNOSIS — S82831A Other fracture of upper and lower end of right fibula, initial encounter for closed fracture: Secondary | ICD-10-CM | POA: Insufficient documentation

## 2022-01-23 LAB — RPR: RPR Ser Ql: NONREACTIVE

## 2022-01-23 LAB — URINALYSIS, ROUTINE W REFLEX MICROSCOPIC
Bacteria, UA: NONE SEEN
Bilirubin Urine: NEGATIVE
Glucose, UA: NEGATIVE mg/dL
Hgb urine dipstick: NEGATIVE
Ketones, ur: 80 mg/dL — AB
Leukocytes,Ua: NEGATIVE
Nitrite: NEGATIVE
Protein, ur: 30 mg/dL — AB
Specific Gravity, Urine: 1.034 — ABNORMAL HIGH (ref 1.005–1.030)
pH: 5 (ref 5.0–8.0)

## 2022-01-23 LAB — BASIC METABOLIC PANEL
Anion gap: 16 — ABNORMAL HIGH (ref 5–15)
BUN: 19 mg/dL (ref 6–20)
CO2: 18 mmol/L — ABNORMAL LOW (ref 22–32)
Calcium: 9.3 mg/dL (ref 8.9–10.3)
Chloride: 102 mmol/L (ref 98–111)
Creatinine, Ser: 1.08 mg/dL (ref 0.61–1.24)
GFR, Estimated: >60 mL/min (ref >60)
Glucose, Bld: 86 mg/dL (ref 70–99)
Potassium: 4.1 mmol/L (ref 3.5–5.1)
Sodium: 136 mmol/L (ref 135–145)

## 2022-01-23 LAB — CBC WITH DIFFERENTIAL/PLATELET
Abs Immature Granulocytes: 0.06 10*3/uL (ref 0.00–0.07)
Basophils Absolute: 0 10*3/uL (ref 0.0–0.1)
Basophils Relative: 0 %
Eosinophils Absolute: 0 10*3/uL (ref 0.0–0.5)
Eosinophils Relative: 0 %
HCT: 46 % (ref 39.0–52.0)
Hemoglobin: 15 g/dL (ref 13.0–17.0)
Immature Granulocytes: 0 %
Lymphocytes Relative: 9 %
Lymphs Abs: 1.3 10*3/uL (ref 0.7–4.0)
MCH: 29.9 pg (ref 26.0–34.0)
MCHC: 32.6 g/dL (ref 30.0–36.0)
MCV: 91.8 fL (ref 80.0–100.0)
Monocytes Absolute: 1 10*3/uL (ref 0.1–1.0)
Monocytes Relative: 7 %
Neutro Abs: 12.7 10*3/uL — ABNORMAL HIGH (ref 1.7–7.7)
Neutrophils Relative %: 84 %
Platelets: 191 10*3/uL (ref 150–400)
RBC: 5.01 MIL/uL (ref 4.22–5.81)
RDW: 13.1 % (ref 11.5–15.5)
WBC: 15.1 10*3/uL — ABNORMAL HIGH (ref 4.0–10.5)
nRBC: 0 % (ref 0.0–0.2)

## 2022-01-23 LAB — HIV ANTIBODY (ROUTINE TESTING W REFLEX): HIV Screen 4th Generation wRfx: NONREACTIVE

## 2022-01-23 MED ORDER — ONDANSETRON HCL 4 MG/2ML IJ SOLN
4.0000 mg | Freq: Once | INTRAMUSCULAR | Status: AC
  Administered 2022-01-23: 4 mg via INTRAVENOUS
  Filled 2022-01-23: qty 2

## 2022-01-23 MED ORDER — CEFTRIAXONE SODIUM 500 MG IJ SOLR
500.0000 mg | Freq: Once | INTRAMUSCULAR | Status: AC
  Administered 2022-01-23: 500 mg via INTRAMUSCULAR
  Filled 2022-01-23: qty 500

## 2022-01-23 MED ORDER — PENICILLIN G BENZATHINE 1200000 UNIT/2ML IM SUSY
2.4000 10*6.[IU] | PREFILLED_SYRINGE | Freq: Once | INTRAMUSCULAR | Status: AC
  Administered 2022-01-23: 2.4 10*6.[IU] via INTRAMUSCULAR
  Filled 2022-01-23: qty 4

## 2022-01-23 MED ORDER — AZITHROMYCIN 250 MG PO TABS
1000.0000 mg | ORAL_TABLET | Freq: Once | ORAL | Status: AC
  Administered 2022-01-23: 1000 mg via ORAL
  Filled 2022-01-23: qty 4

## 2022-01-23 MED ORDER — STERILE WATER FOR INJECTION IJ SOLN
INTRAMUSCULAR | Status: AC
  Administered 2022-01-23: 10 mL
  Filled 2022-01-23: qty 10

## 2022-01-23 MED ORDER — MORPHINE SULFATE (PF) 4 MG/ML IV SOLN
4.0000 mg | Freq: Once | INTRAVENOUS | Status: AC
  Administered 2022-01-23: 4 mg via INTRAVENOUS
  Filled 2022-01-23: qty 1

## 2022-01-23 MED ORDER — ACYCLOVIR 400 MG PO TABS: 800.0000 mg | ORAL_TABLET | Freq: Three times a day (TID) | ORAL | 0 refills | Status: AC

## 2022-01-23 MED ORDER — OXYCODONE-ACETAMINOPHEN 5-325 MG PO TABS: 2.0000 | ORAL_TABLET | Freq: Four times a day (QID) | ORAL | 0 refills | Status: AC | PRN

## 2022-01-23 MED ORDER — HYDROCODONE-ACETAMINOPHEN 5-325 MG PO TABS
1.0000 | ORAL_TABLET | Freq: Once | ORAL | Status: AC
  Administered 2022-01-23: 1 via ORAL
  Filled 2022-01-23: qty 1

## 2022-01-23 MED ORDER — FENTANYL CITRATE PF 50 MCG/ML IJ SOSY
50.0000 ug | PREFILLED_SYRINGE | Freq: Once | INTRAMUSCULAR | Status: AC
  Administered 2022-01-23: 50 ug via INTRAVENOUS
  Filled 2022-01-23: qty 1
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
## 2022-01-23 NOTE — ED Notes (Signed)
Pt has label specimen cup

## 2022-01-23 NOTE — ED Notes (Signed)
PT stated to this NT that he was in pain and starting to have nausea.

## 2022-01-25 ENCOUNTER — Encounter (HOSPITAL_COMMUNITY): Payer: Self-pay | Admitting: Emergency Medicine

## 2022-01-26 LAB — HSV CULTURE AND TYPING

## 2022-02-08 ENCOUNTER — Encounter: Payer: Medicaid Other | Admitting: Student

## 2022-02-15 ENCOUNTER — Ambulatory Visit (INDEPENDENT_AMBULATORY_CARE_PROVIDER_SITE_OTHER): Payer: Self-pay

## 2022-02-15 ENCOUNTER — Other Ambulatory Visit: Payer: Self-pay

## 2022-02-15 VITALS — BP 134/84 | HR 74 | Temp 97.9°F | Ht 74.0 in | Wt 180.3 lb

## 2022-02-15 DIAGNOSIS — S82831G Other fracture of upper and lower end of right fibula, subsequent encounter for closed fracture with delayed healing: Secondary | ICD-10-CM

## 2022-02-15 DIAGNOSIS — S82141G Displaced bicondylar fracture of right tibia, subsequent encounter for closed fracture with delayed healing: Secondary | ICD-10-CM

## 2022-02-15 DIAGNOSIS — B009 Herpesviral infection, unspecified: Secondary | ICD-10-CM

## 2022-02-15 DIAGNOSIS — S82141D Displaced bicondylar fracture of right tibia, subsequent encounter for closed fracture with routine healing: Secondary | ICD-10-CM

## 2022-02-15 DIAGNOSIS — S82141A Displaced bicondylar fracture of right tibia, initial encounter for closed fracture: Secondary | ICD-10-CM | POA: Insufficient documentation

## 2022-02-15 MED ORDER — VALACYCLOVIR HCL 500 MG PO TABS: 500.0000 mg | ORAL_TABLET | Freq: Two times a day (BID) | ORAL | 2 refills | Status: DC

## 2022-02-15 MED ORDER — NAPROXEN 500 MG PO TABS: 500.0000 mg | ORAL_TABLET | Freq: Two times a day (BID) | ORAL | 2 refills | Status: DC

## 2022-02-15 MED ORDER — OXYCODONE HCL 5 MG PO TABS: 5.0000 mg | ORAL_TABLET | Freq: Three times a day (TID) | ORAL | 0 refills | Status: DC | PRN

## 2022-02-15 MED ORDER — NAPROXEN 500 MG PO TABS: 500.0000 mg | ORAL_TABLET | Freq: Two times a day (BID) | ORAL | 0 refills | Status: DC

## 2022-02-15 NOTE — Patient Instructions (Addendum)
Thank you, [name] [name] for allowing [place] to provide your care today. Today we discussed :  Knee pain tibial fracture fibular fracture- We have placed an urgent referral to orthopedic surgery. We are giving you an NSAID called naproxen to help with pain and inflammation. Take this twice daily. You can take 1000 mg of tylenol every 6 hours as needed, but do not take more than 4000 mg a day. We have given you 30 tablets of oxycodone 5 mg. Take this as needed for severe pain as not to run out quickly and have poor pain control.  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 If you find an ulcer or multiple ulcers take 500 mg twice a day for 5 days. I prescribed a refill also.   Referrals ordered today:   Referral Orders         Ambulatory referral to Orthopedic Surgery      I have ordered the following medication/changed the following medications:   Stop the following medications: Medications Discontinued During This Encounter  Medication Reason   naproxen (NAPROSYN) 500 MG tablet      Start the following medications: Meds ordered this encounter  Medications   oxyCODONE (ROXICODONE) 5 MG immediate release tablet    Sig: Take 1 tablet (5 mg total) by mouth every 8 (eight) hours as needed.    Dispense:  30 tablet    Refill:  0   DISCONTD: naproxen (NAPROSYN) 500 MG tablet    Sig: Take 1 tablet (500 mg total) by mouth 2 (two) times daily with a meal.    Dispense:  60 tablet    Refill:  2   valACYclovir (VALTREX) 500 MG tablet    Sig: Take 1 tablet (500 mg total) by mouth 2 (two) times daily for 15 days.    Dispense:  10 tablet    Refill:  2   naproxen (NAPROSYN) 500 MG tablet    Sig: Take 1 tablet (500 mg total) by mouth 2 (two) times daily with a meal.    Dispense:  60 tablet    Refill:  0     Follow up: As needed    We look forward to seeing you next time. Please call our clinic at [phone] if you have any questions or concerns. The best time to  call is Monday-Friday from 9am-4pm, but there is someone available 24/7. If after hours or the weekend, call the main hospital number and ask for the Internal Medicine Resident On-Call. If you need medication refills, please notify your pharmacy one week in advance and they will send [place] a request.   Thank you for trusting me with your care. Wishing you the best!   [name] Cluster, MD [facility]

## 2022-02-15 NOTE — Assessment & Plan Note (Addendum)
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 He was treated with penicillin and acyclovir. On exam there is no evidence of penile sore ore grouped vesicles. -Valcyclovir 500mg  BID for 5 days for future sores or ulcers

## 2022-02-15 NOTE — Progress Notes (Deleted)
ED 7/4: penile sore painful, hyperextension of R knee w swelling and inability to bear weight.  Ortho recommended knee immobilization with outpatient ortho follow up  Empiric treatment for syphillis but RPR negative HSV culture positive for HSV2, HIV negative, hep panel neg  CT knee showing fibular head, anterior medial tibial plateau, possible anterior lateral tibial plateau fractures  7/10 pain

## 2022-02-15 NOTE — Progress Notes (Signed)
Established Patient Office Visit  Subjective   Patient ID: [name] [name], male    DOB: May 20, 2001  Age: 21 y.o. MRN: [id number]  Chief Complaint  Patient presents with   F/U STD's   Follow-up    ED - for right knee.    [name] [name] is a 21 y/o male with a pmh outlined below who presents for ED follow up for tibial fracture. See A/P for HPI.      Review of Systems  All other systems reviewed and are negative.     Objective:     BP 134/84 (BP Location: Left Arm, Patient Position: Sitting, Cuff Size: Normal)   Pulse 74   Temp 97.9 F (36.6 C) (Oral)   Ht 6\' 2"  (1.88 m)   Wt 180 lb 4.8 oz (81.8 kg)   SpO2 97% Comment: RA  BMI 23.15 kg/m    Physical Exam Cardiovascular:     Rate and Rhythm: Normal rate and regular rhythm.     Pulses: Normal pulses.     Heart sounds: Normal heart sounds. No murmur heard. Pulmonary:     Effort: Pulmonary effort is normal. No respiratory distress.     Breath sounds: Normal breath sounds. No wheezing or rales.  Genitourinary:    Penis: Normal.   Musculoskeletal:     Comments: Right knee with swelling, ptp at the medial and lateral joint line, neurovascularly intact distally, unable to move out of fully extended position      No results found for any visits on 02/15/22.    The ASCVD Risk score (Arnett DK, et al., 2019) failed to calculate for the following reasons:   The 2019 ASCVD risk score is only valid for ages 67 to 64    Assessment & Plan:   Problem List Items Addressed This Visit       Musculoskeletal and Integument   Tibial plateau fracture, right - Primary    Patient was seen in the ED 7/4 and found to have CT knee showing fibular head, anterior medial tibial plateau, possible anterior lateral tibial plateau fractures in the setting of a hyperextension injury. The ED note mentions talking to ortho but there is no ortho note. The patient was directed to follow up with ortho in the outpatient setting but has not  heard from them. One exam his right knee is swollen with ptp over the lateral and medial joint line, motor/sensory distal intact and foot pulses palpable. 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 He was treated with penicillin and acyclovir. On exam there is no evidence of penile sore ore grouped vesicles. -Valcyclovir 500mg  BID for 5 days for future sores or ulcers      Relevant Medications   valACYclovir (VALTREX) 500 MG tablet   Other Visit Diagnoses     Closed fracture of proximal end of right fibula with delayed healing, unspecified fracture morphology, subsequent encounter       Relevant Medications  oxyCODONE (ROXICODONE) 5 MG immediate release tablet   naproxen (NAPROSYN) 500 MG tablet   Other Relevant Orders   Ambulatory referral to Orthopedic Surgery       No follow-ups on file.    Willette Cluster, MD

## 2022-02-15 NOTE — Assessment & Plan Note (Signed)
Patient was seen in the ED 7/4 and found to have CT knee showing fibular head, anterior medial tibial plateau, possible anterior lateral tibial plateau fractures in the setting of a hyperextension injury. The ED note mentions talking to ortho but there is no ortho note. The patient was directed to follow up with ortho in the outpatient setting but has not heard from them. One exam his right knee is swollen with ptp over the lateral and medial joint line, motor/sensory distal intact and foot pulses palpable. -Oxycodone 5mg  q8hrs prn -naproxen 500mg  BID -Tylenol 1g q6 hours prn -urgent ortho referral

## 2022-02-16 ENCOUNTER — Other Ambulatory Visit: Payer: Self-pay | Admitting: Internal Medicine

## 2022-02-16 ENCOUNTER — Other Ambulatory Visit: Payer: Self-pay

## 2022-02-16 DIAGNOSIS — B009 Herpesviral infection, unspecified: Secondary | ICD-10-CM

## 2022-02-16 DIAGNOSIS — S82141D Displaced bicondylar fracture of right tibia, subsequent encounter for closed fracture with routine healing: Secondary | ICD-10-CM

## 2022-02-16 DIAGNOSIS — S82831G Other fracture of upper and lower end of right fibula, subsequent encounter for closed fracture with delayed healing: Secondary | ICD-10-CM

## 2022-02-16 MED ORDER — NAPROXEN 500 MG PO TABS: 500.0000 mg | ORAL_TABLET | Freq: Two times a day (BID) | ORAL | 0 refills | Status: AC

## 2022-02-16 MED ORDER — VALACYCLOVIR HCL 1 G PO TABS: 1000.0000 mg | ORAL_TABLET | Freq: Two times a day (BID) | ORAL | 2 refills | Status: AC

## 2022-02-16 MED ORDER — OXYCODONE HCL 5 MG PO TABS: 5.0000 mg | ORAL_TABLET | Freq: Three times a day (TID) | ORAL | 0 refills | Status: DC | PRN

## 2022-02-16 NOTE — Telephone Encounter (Signed)
New prescription needs to be sent to pharmacy.

## 2022-02-16 NOTE — Progress Notes (Signed)
Internal Medicine Clinic Attending  I saw and evaluated the patient.  I personally confirmed the key portions of the history and exam documented by Dr. Aundria Rud and I reviewed pertinent patient test results.  The assessment, diagnosis, and plan were formulated together and I agree with the documentation in the resident's note.   Agree with urgent Ortho referral for knee and plan for pain control outlined in resident note. For future HSV outbreaks, will update Valtrex to 1g BID for 7-10 days

## 2022-02-16 NOTE — Telephone Encounter (Signed)
Pt states oxyCODONE (ROXICODONE) 5 MG immediate release tablet is not at the pharmacy. States he need this medication by today. Pt is using [id number] - [place], [place] - [address] AT [facility].

## 2022-02-16 NOTE — Addendum Note (Signed)
Addended by: Lucille Passy on: 02/16/2022 04:14 PM   Modules accepted: Orders

## 2022-02-19 ENCOUNTER — Other Ambulatory Visit: Payer: Self-pay | Admitting: *Deleted

## 2022-02-19 ENCOUNTER — Other Ambulatory Visit: Payer: Self-pay | Admitting: Student

## 2022-02-19 DIAGNOSIS — S82141D Displaced bicondylar fracture of right tibia, subsequent encounter for closed fracture with routine healing: Secondary | ICD-10-CM

## 2022-02-19 DIAGNOSIS — S82831G Other fracture of upper and lower end of right fibula, subsequent encounter for closed fracture with delayed healing: Secondary | ICD-10-CM

## 2022-02-19 MED ORDER — OXYCODONE HCL 5 MG PO TABS: 5.0000 mg | ORAL_TABLET | Freq: Three times a day (TID) | ORAL | 0 refills | Status: DC | PRN

## 2022-02-19 NOTE — Telephone Encounter (Signed)
Call from pt who stated the pharmacy never received Oxycodone rx. Rx was sent as"Print" on 7/28. Re-send rx electronically. Thanks

## 2022-02-19 NOTE — Telephone Encounter (Signed)
Pt called / informed of Oxycodone rx .

## 2022-03-09 ENCOUNTER — Ambulatory Visit (INDEPENDENT_AMBULATORY_CARE_PROVIDER_SITE_OTHER): Payer: Commercial Managed Care - HMO

## 2022-03-09 ENCOUNTER — Ambulatory Visit: Payer: Commercial Managed Care - HMO | Admitting: Orthopedic Surgery

## 2022-03-09 DIAGNOSIS — M25561 Pain in right knee: Secondary | ICD-10-CM | POA: Diagnosis not present

## 2022-03-09 DIAGNOSIS — S82141A Displaced bicondylar fracture of right tibia, initial encounter for closed fracture: Secondary | ICD-10-CM

## 2022-03-09 MED ORDER — MELOXICAM 15 MG PO TABS: 15.0000 mg | ORAL_TABLET | Freq: Every day | ORAL | 0 refills | Status: DC

## 2022-03-09 MED ORDER — TRAMADOL HCL 50 MG PO TABS: 50.0000 mg | ORAL_TABLET | Freq: Three times a day (TID) | ORAL | 0 refills | Status: DC | PRN

## 2022-03-10 ENCOUNTER — Encounter: Payer: Self-pay | Admitting: Orthopedic Surgery
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
## 2022-04-13 ENCOUNTER — Ambulatory Visit: Payer: Commercial Managed Care - HMO | Admitting: Orthopedic Surgery

## 2022-08-15 ENCOUNTER — Encounter: Payer: Commercial Managed Care - HMO | Admitting: Internal Medicine

## 2022-08-15 NOTE — Progress Notes (Incomplete)
   CC: ***  HPI:Mr.JADARIAN MCKAY is a 22 y.o. male who presents for evaluation of ***. Please see individual problem based A/P for details.  HSV2 outbreak Presented back in July to ED for itchy pale based ulcer found to be HSV2 positive. All other STI negative.  - any new partners since July? Protected or unprotected?  - if new and/or unprotected, may need to test again  - could still send in valacyclovir 1000mg  BID? daily 7? days  Depression, PHQ-9: Based on the patients  score we have ***.  Past Medical History:  Diagnosis Date  . Asthma    as a child only " grew out of it"  . HTN (hypertension)   . Mandible fracture ([place])   . Migraine    Review of Systems:   ROS   Physical Exam: There were no vitals filed for this visit.   General: *** HEENT: Conjunctiva nl , antiicteric sclerae, moist mucous membranes, no exudate or erythema Cardiovascular: Normal rate, regular rhythm.  No murmurs, rubs, or gallops Pulmonary : Equal breath sounds, No wheezes, rales, or rhonchi Abdominal: soft, nontender,  bowel sounds present Ext: No edema in lower extremities, no tenderness to palpation of lower extremities.   Assessment & Plan:   See Encounters Tab for problem based charting.  Patient {GC/GE:[id number]::"discussed with","seen with"} Dr. {YKDXI:[id number]::"L. Hoffman","Guilloud","Mullen","Narendra","Raines","Vincent","Williams"}

## 2022-11-11 DIAGNOSIS — R369 Urethral discharge, unspecified: Secondary | ICD-10-CM | POA: Diagnosis not present

## 2022-11-11 DIAGNOSIS — Z202 Contact with and (suspected) exposure to infections with a predominantly sexual mode of transmission: Secondary | ICD-10-CM | POA: Diagnosis not present

## 2022-11-11 DIAGNOSIS — R300 Dysuria: Secondary | ICD-10-CM | POA: Diagnosis not present

## 2022-11-11 DIAGNOSIS — F17290 Nicotine dependence, other tobacco product, uncomplicated: Secondary | ICD-10-CM | POA: Diagnosis not present

## 2023-11-14 ENCOUNTER — Other Ambulatory Visit: Payer: Self-pay

## 2023-11-14 ENCOUNTER — Emergency Department (HOSPITAL_COMMUNITY)

## 2023-11-14 ENCOUNTER — Emergency Department (HOSPITAL_COMMUNITY)
Admission: EM | Admit: 2023-11-14 | Discharge: 2023-11-15 | Disposition: A | Discharge status: Home / Self Care | Attending: Emergency Medicine | Admitting: Emergency Medicine

## 2023-11-14 ENCOUNTER — Encounter (HOSPITAL_COMMUNITY): Payer: Self-pay | Admitting: Emergency Medicine

## 2023-11-14 DIAGNOSIS — M7989 Other specified soft tissue disorders: Secondary | ICD-10-CM | POA: Insufficient documentation

## 2023-11-14 DIAGNOSIS — J45909 Unspecified asthma, uncomplicated: Secondary | ICD-10-CM | POA: Diagnosis not present

## 2023-11-14 DIAGNOSIS — S62304A Unspecified fracture of fourth metacarpal bone, right hand, initial encounter for closed fracture: Secondary | ICD-10-CM | POA: Diagnosis not present

## 2023-11-14 DIAGNOSIS — S62354A Nondisplaced fracture of shaft of fourth metacarpal bone, right hand, initial encounter for closed fracture: Secondary | ICD-10-CM | POA: Insufficient documentation

## 2023-11-14 DIAGNOSIS — I10 Essential (primary) hypertension: Secondary | ICD-10-CM | POA: Insufficient documentation

## 2023-11-14 DIAGNOSIS — W228XXA Striking against or struck by other objects, initial encounter: Secondary | ICD-10-CM | POA: Diagnosis not present

## 2023-11-14 DIAGNOSIS — S060X0A Concussion without loss of consciousness, initial encounter: Secondary | ICD-10-CM | POA: Diagnosis not present

## 2023-11-14 DIAGNOSIS — Z23 Encounter for immunization: Secondary | ICD-10-CM | POA: Insufficient documentation

## 2023-11-14 DIAGNOSIS — S0101XA Laceration without foreign body of scalp, initial encounter: Secondary | ICD-10-CM | POA: Diagnosis not present

## 2023-11-14 DIAGNOSIS — R21 Rash and other nonspecific skin eruption: Secondary | ICD-10-CM

## 2023-11-14 DIAGNOSIS — S0990XA Unspecified injury of head, initial encounter: Secondary | ICD-10-CM | POA: Diagnosis not present

## 2023-11-14 MED ORDER — TETANUS-DIPHTH-ACELL PERTUSSIS 5-2.5-18.5 LF-MCG/0.5 IM SUSY
0.5000 mL | PREFILLED_SYRINGE | Freq: Once | INTRAMUSCULAR | Status: AC
  Administered 2023-11-15: 0.5 mL via INTRAMUSCULAR
  Filled 2023-11-14: qty 0.5

## 2023-11-14 MED ORDER — LIDOCAINE-EPINEPHRINE-TETRACAINE (LET) TOPICAL GEL
3.0000 mL | Freq: Once | TOPICAL | Status: AC
  Administered 2023-11-15: 3 mL via TOPICAL
  Filled 2023-11-14: qty 3
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
## 2023-11-15 ENCOUNTER — Emergency Department (HOSPITAL_COMMUNITY)

## 2023-11-15 DIAGNOSIS — S0990XA Unspecified injury of head, initial encounter: Secondary | ICD-10-CM | POA: Diagnosis not present

## 2023-11-15 MED ORDER — TRIAMCINOLONE ACETONIDE 0.025 % EX CREA
TOPICAL_CREAM | Freq: Two times a day (BID) | CUTANEOUS | Status: DC
  Administered 2023-11-15: 1 via TOPICAL
  Filled 2023-11-15: qty 15
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
## 2023-11-15 NOTE — Progress Notes (Signed)
 Orthopedic Tech Progress Note Patient Details:  CHRISTYAN REGER May 30, 2001 [id number] Applied volar splint per order.  Ortho Devices Type of Ortho Device: Volar splint Ortho Device/Splint Location: RUE Ortho Device/Splint Interventions: Ordered, Application, Adjustment   Post Interventions Patient Tolerated: Well Instructions Provided: Adjustment of device, Care of device  Rayna Calkin 11/15/2023, 1:31 AM

## 2023-11-15 NOTE — Discharge Instructions (Addendum)
 Apply the lotion to both feet twice a day for 2 weeks.  If no improvement, follow-up with your primary doctor  Follow-up with the physician-Dr. Spears-for the broken hand

## 2024-01-01 ENCOUNTER — Other Ambulatory Visit: Payer: Self-pay

## 2024-01-01 ENCOUNTER — Encounter (HOSPITAL_COMMUNITY): Payer: Self-pay

## 2024-01-01 ENCOUNTER — Emergency Department (HOSPITAL_COMMUNITY)

## 2024-01-01 ENCOUNTER — Emergency Department (HOSPITAL_COMMUNITY)
Admission: EM | Admit: 2024-01-01 | Discharge: 2024-01-01 | Disposition: A | Discharge status: Home / Self Care | Attending: Emergency Medicine | Admitting: Emergency Medicine

## 2024-01-01 DIAGNOSIS — Z23 Encounter for immunization: Secondary | ICD-10-CM | POA: Diagnosis not present

## 2024-01-01 DIAGNOSIS — S41151A Open bite of right upper arm, initial encounter: Secondary | ICD-10-CM | POA: Diagnosis not present

## 2024-01-01 DIAGNOSIS — Z203 Contact with and (suspected) exposure to rabies: Secondary | ICD-10-CM | POA: Diagnosis not present

## 2024-01-01 DIAGNOSIS — Z2914 Encounter for prophylactic rabies immune globin: Secondary | ICD-10-CM | POA: Insufficient documentation

## 2024-01-01 DIAGNOSIS — W540XXA Bitten by dog, initial encounter: Secondary | ICD-10-CM | POA: Diagnosis not present

## 2024-01-01 DIAGNOSIS — S51851A Open bite of right forearm, initial encounter: Secondary | ICD-10-CM | POA: Diagnosis not present

## 2024-01-01 DIAGNOSIS — S62324D Displaced fracture of shaft of fourth metacarpal bone, right hand, subsequent encounter for fracture with routine healing: Secondary | ICD-10-CM | POA: Diagnosis not present

## 2024-01-01 MED ORDER — AMOXICILLIN-POT CLAVULANATE 875-125 MG PO TABS
1.0000 | ORAL_TABLET | Freq: Once | ORAL | Status: AC
Start: 2024-01-01 — End: 2024-01-01
  Administered 2024-01-01: 1 via ORAL
  Filled 2024-01-01: qty 1

## 2024-01-01 MED ORDER — TETANUS-DIPHTH-ACELL PERTUSSIS 5-2.5-18.5 LF-MCG/0.5 IM SUSY
0.5000 mL | PREFILLED_SYRINGE | Freq: Once | INTRAMUSCULAR | Status: AC
  Administered 2024-01-01: 0.5 mL via INTRAMUSCULAR
  Filled 2024-01-01: qty 0.5

## 2024-01-01 MED ORDER — OXYCODONE HCL 5 MG PO TABS
5.0000 mg | ORAL_TABLET | Freq: Once | ORAL | Status: AC
  Administered 2024-01-01: 5 mg via ORAL
  Filled 2024-01-01: qty 1

## 2024-01-01 MED ORDER — RABIES IMMUNE GLOBULIN 300 UNIT/2ML IJ SOLN
20.0000 [IU]/kg | Freq: Once | INTRAMUSCULAR | Status: AC
Start: 2024-01-01 — End: 2024-01-01
  Administered 2024-01-01: 1500 [IU] via INTRAMUSCULAR
  Filled 2024-01-01: qty 10

## 2024-01-01 MED ORDER — ACETAMINOPHEN 500 MG PO TABS
1000.0000 mg | ORAL_TABLET | Freq: Once | ORAL | Status: AC
  Administered 2024-01-01: 1000 mg via ORAL
  Filled 2024-01-01: qty 2

## 2024-01-01 MED ORDER — RABIES VACCINE, PCEC IM SUSR
1.0000 mL | Freq: Once | INTRAMUSCULAR | Status: AC
  Administered 2024-01-01: 1 mL via INTRAMUSCULAR
  Filled 2024-01-01: qty 1

## 2024-01-01 NOTE — ED Triage Notes (Signed)
 Pt states he bit by a dog, unknown on vaccination status. Dog bite to right forearm. Bleeding controlled.

## 2024-01-01 NOTE — ED Provider Notes (Signed)
 [place] EMERGENCY DEPARTMENT AT [facility] Provider Note   CSN: [id number] Arrival date & time: 01/01/24  1821     History  Chief Complaint  Patient presents with   Animal Bite    [name] [name] is a 23 y.o. male.  23 year old male here today after he was bit by a large dog on the right arm.  Unsure of the vaccination status.   Animal Bite      Home Medications Prior to Admission medications   Not on File      Allergies    Patient has no known allergies.    Review of Systems   Review of Systems  Physical Exam Updated Vital Signs BP 105/61 (BP Location: Left Arm)   Pulse 90   Temp 98.4 F (36.9 C) (Oral)   Resp 18   Ht 6' 1 (1.854 m)   Wt 74.8 kg   SpO2 100%   BMI 21.77 kg/m  Physical Exam Vitals reviewed.  Musculoskeletal:     Comments: Soft compartments, tenderness over the right distal forearm  Skin:    General: Skin is warm and dry.     Comments: Bite marks over the dorsal and volar forearm, small 1 cm x 1 cm open wound on the distal forearm.  No active bleeding.  Neurological:     Mental Status: He is alert.     Comments: Intact median, radial, ulnar nerve sensation and function.     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 Healing fracture of the fourth metacarpal shaft faintly visible on the lateral projection, better shown on prior dedicated hand radiographs of 11/14/2023. There is gas in the soft tissues volar to the distal radius compatible with laceration. No retained canine  tooth or other specific foreign body observed. IMPRESSION: 1. Gas in the soft tissues volar to the distal radius compatible with laceration. No retained canine tooth or other specific foreign body observed. 2. Healing fracture of the fourth metacarpal shaft. 3. 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 Electronically Signed   By: Freida Jes M.D.   On: 01/01/2024 19:31    Procedures Procedures    Medications Ordered in ED Medications  rabies immune globulin (HYPERRAB) injection 1,500 Units (has no administration in time range)  rabies vaccine (RABAVERT) injection 1 mL (has no administration in time range)  oxyCODONE  (Oxy IR/ROXICODONE ) immediate release tablet 5 mg (5 mg Oral Given 01/01/24 1945)  acetaminophen  (TYLENOL ) tablet 1,000 mg (1,000 mg Oral Given 01/01/24 1945)  Tdap (BOOSTRIX ) injection 0.5 mL (0.5 mLs Intramuscular Given 01/01/24 1945)  amoxicillin -clavulanate (AUGMENTIN) 875-125 MG per tablet 1 tablet (1 tablet Oral Given 01/01/24 1945)    ED Course/ Medical Decision Making/ A&P                                 Medical Decision Making 23 year old male here today after he was bit by a large dog on the forearm.  Plan-will obtain imaging of the patient's forearm.  Patient without any  evidence of tendon injury, neurovascular injury.  Overall wounds appear fairly superficial.  Largest bite wound is approximately 1 x 1 cm.  Given the noncosmetic area, believe it is better to heal by secondary intention rather than increasing risk of infection with suturing.  Will plan to irrigate wound, provide antibiotics.  Have ordered rabies treatment for the patient as they are not sure the dog's vaccination status.  Reassessment 8 PM-negative plain films.  Wounds cleaned and dressed.  Augmentin given.  Rabies prophylaxis ordered.  Patient updated on need to return to the ED for repeat rabies prophylaxis.  Amount and/or Complexity of Data  Reviewed Radiology: ordered.  Risk OTC drugs. Prescription drug management.           Final Clinical Impression(s) / ED Diagnoses Final diagnoses:  Dog bite of right upper extremity, initial encounter    Rx / DC Orders ED Discharge Orders     None         Nathanael Baker, DO 01/01/24 2009

## 2024-01-01 NOTE — Discharge Instructions (Addendum)
 You need to return to the ED on Saturday, June 14, Wednesday, June 18, and Wednesday, June 25 for repeat rabies shots.  I sent you prescription for Augmentin.  You can take this once in the morning once in the evening for the next 7 days.  Keep the wounds on your arm clean with soap and water .  Apply antibiotic ointment and a bandage.  They should heal over the next couple of days.

## 2024-02-24 ENCOUNTER — Emergency Department (HOSPITAL_COMMUNITY)
Admission: EM | Admit: 2024-02-24 | Discharge: 2024-02-25 | Disposition: A | Discharge status: Home / Self Care | Attending: Emergency Medicine | Admitting: Emergency Medicine

## 2024-02-24 ENCOUNTER — Encounter (HOSPITAL_COMMUNITY): Payer: Self-pay | Admitting: Emergency Medicine

## 2024-02-24 DIAGNOSIS — B9689 Other specified bacterial agents as the cause of diseases classified elsewhere: Secondary | ICD-10-CM | POA: Diagnosis not present

## 2024-02-24 DIAGNOSIS — H5789 Other specified disorders of eye and adnexa: Secondary | ICD-10-CM | POA: Diagnosis present

## 2024-02-24 DIAGNOSIS — H1033 Unspecified acute conjunctivitis, bilateral: Secondary | ICD-10-CM | POA: Insufficient documentation

## 2024-02-24 NOTE — ED Triage Notes (Signed)
 Pt complains of eye discharge, yellow in nature frr 2 weeks. Using OTC eye drops.

## 2024-02-25 MED ORDER — NEOMYCIN-POLYMYXIN-DEXAMETH 3.5-10000-0.1 OP SUSP
1.0000 [drp] | Freq: Once | OPHTHALMIC | Status: AC
  Administered 2024-02-25: 1 [drp] via OPHTHALMIC
  Filled 2024-02-25: qty 0.1

## 2024-02-25 MED ORDER — OLOPATADINE HCL 0.1 % OP SOLN: 1.0000 [drp] | Freq: Two times a day (BID) | OPHTHALMIC | 12 refills | Status: AC

## 2024-02-25 MED ORDER — NEOMYCIN-POLYMYXIN-DEXAMETH 3.5-10000-0.1 OP SUSP: 1.0000 [drp] | OPHTHALMIC | 0 refills | Status: AC

## 2024-02-25 MED ORDER — NEOMYCIN-POLYMYXIN-DEXAMETH 3.5-10000-0.1 OP OINT
TOPICAL_OINTMENT | Freq: Once | OPHTHALMIC | Status: DC
  Filled 2024-02-25: qty 3.5

## 2024-02-25 NOTE — ED Provider Notes (Signed)
 [place] EMERGENCY DEPARTMENT AT [facility] Provider Note   CSN: [id number] Arrival date & time: 02/24/24  2243    Patient presents with: Conjunctivitis   [name] [name] is a 23 y.o. male here for evaluation of bilateral eye drainage.  Started 2 weeks ago.  Eyes pruritic in nature. No pain to eyes. Does not wear contacts or glasses.  Has not been seen by anyone.  Has noted he has had some redness to his eyes however denies any blurred vision, diplopia, trauma or injury. No HA, facial swelling, sun exposure, photosensitivity. Denies concern for STI in the eyes, urinary complaints.    HPI     Prior to Admission medications   Medication Sig Start Date End Date Taking? Authorizing Provider  neomycin -polymyxin b-dexamethasone  (MAXITROL ) 3.5-10000-0.1 SUSP Place 1 drop into both eyes every 4 (four) hours. 02/25/24  Yes Darrian Grzelak A, PA-C  olopatadine  (PATADAY ) 0.1 % ophthalmic solution Place 1 drop into both eyes 2 (two) times daily. 02/25/24  Yes Jazzalyn Loewenstein A, PA-C    Allergies: Patient has no known allergies.    Review of Systems  Constitutional: Negative.   HENT: Negative.    Eyes:  Positive for discharge, redness and itching. Negative for photophobia and visual disturbance.  Respiratory: Negative.    Gastrointestinal: Negative.   Genitourinary: Negative.   Skin: Negative.   Neurological: Negative.   All other systems reviewed and are negative.   Updated Vital Signs BP 112/80 (BP Location: Left Arm)   Pulse 83   Temp 98.5 F (36.9 C) (Oral)   Resp 16   SpO2 96%   Physical Exam Vitals and nursing note reviewed.  Constitutional:      General: He is not in acute distress.    Appearance: He is well-developed. He is not ill-appearing, toxic-appearing or diaphoretic.  HENT:     Head: Normocephalic and atraumatic.  Eyes:     General: No allergic shiner, visual field deficit or scleral icterus.       Right eye: Discharge present. No foreign body.         Left eye: Discharge present.No foreign body.     Extraocular Movements: Extraocular movements intact.     Conjunctiva/sclera:     Right eye: Right conjunctiva is injected.     Left eye: Left conjunctiva is injected.     Pupils: Pupils are equal, round, and reactive to light.     Comments: Yellow, purulent drainage bilateral eyes.  Injected sclera bilaterally. No chemosis. Neg fluorescein BIL. PERRLA. Full ROM wo pain.  Cardiovascular:     Rate and Rhythm: Normal rate and regular rhythm.  Pulmonary:     Effort: Pulmonary effort is normal. No respiratory distress.  Abdominal:     General: There is no distension.     Palpations: Abdomen is soft.  Musculoskeletal:        General: Normal range of motion.     Cervical back: Normal range of motion and neck supple.  Skin:    General: Skin is warm and dry.  Neurological:     General: No focal deficit present.     Mental Status: He is alert and oriented to person, place, and time.     (all labs ordered are listed, but only abnormal results are displayed) Labs Reviewed - No data to display  EKG: None  Radiology: No results found.   Procedures   Medications Ordered in the ED  neomycin -polymyxin b-dexamethasone  (MAXITROL ) ophthalmic suspension 1-2 drop (1 drop  Both Eyes Given 02/25/24 0403)    Patient here for bilateral purulent drainage to eyes with conjunctival injection and pruritus.  No history of similar.  Here he has moderate yellow drainage, crusting lesions to bilateral eyes. Neg fluorescein uptake, neg seidel sign. Does not wear contacts. Denies changes in vision, concern for STD in eyes.  Will treat with Maxitrol  drops and have close FU with ophthalmology today.  He was given contact number.  He was encouraged to call in a few hours when they open.  Low suspicion for iritis, uveitis, corneal ulcer, abrasion, acute angle glaucoma, periorbital/orbital cellulitis, chlamydial/gonococcal infection, vasculitis, Kawasaki disease,  keratitis, endophthalmitis  The patient has been appropriately medically screened and/or stabilized in the ED. I have low suspicion for any other emergent medical condition which would require further screening, evaluation or treatment in the ED or require inpatient management.  Patient is hemodynamically stable and in no acute distress.  Patient able to ambulate in department prior to ED.  Evaluation does not show acute pathology that would require ongoing or additional emergent interventions while in the emergency department or further inpatient treatment.  I have discussed the diagnosis with the patient and answered all questions.  Pain is been managed while in the emergency department and patient has no further complaints prior to discharge.  Patient is comfortable with plan discussed in room and is stable for discharge at this time.  I have discussed strict return precautions for returning to the emergency department.  Patient was encouraged to follow-up with PCP/specialist refer to at discharge.                                    Medical Decision Making Amount and/or Complexity of Data Reviewed Independent Historian: friend  Risk OTC drugs. Prescription drug management. Decision regarding hospitalization. Diagnosis or treatment significantly limited by social determinants of health.       Final diagnoses:  Acute bacterial conjunctivitis of both eyes    ED Discharge Orders          Ordered    neomycin -polymyxin b-dexamethasone  (MAXITROL ) 3.5-10000-0.1 SUSP  Every 4 hours        02/25/24 0314    olopatadine  (PATADAY ) 0.1 % ophthalmic solution  2 times daily        02/25/24 0314               Selmer Adduci A, PA-C 02/25/24 0539    Raford Lenis, MD 02/25/24 [phone]

## 2024-02-25 NOTE — Discharge Instructions (Addendum)
 It was a pleasure taking care of you here today  I have prescribed 2 different eyedrops.  Use them as prescribed.  Call the ophthalmologist at 8 AM this morning to be seen  Return for new or worsening symptoms

## 2024-02-25 NOTE — ED Notes (Signed)
 Pt called x2 for registration with no answer

## 2024-02-25 NOTE — ED Notes (Signed)
Pt discharged. Pt given discharge papers and papers explained. Pt in NAD at this time

## 2024-05-05 ENCOUNTER — Encounter (HOSPITAL_COMMUNITY): Payer: Self-pay

## 2024-05-05 ENCOUNTER — Emergency Department (HOSPITAL_COMMUNITY)
Admission: EM | Admit: 2024-05-05 | Discharge: 2024-05-05 | Disposition: A | Discharge status: Home / Self Care | Attending: Emergency Medicine | Admitting: Emergency Medicine

## 2024-05-05 ENCOUNTER — Other Ambulatory Visit: Payer: Self-pay

## 2024-05-05 DIAGNOSIS — J45909 Unspecified asthma, uncomplicated: Secondary | ICD-10-CM | POA: Insufficient documentation

## 2024-05-05 DIAGNOSIS — L309 Dermatitis, unspecified: Secondary | ICD-10-CM | POA: Insufficient documentation

## 2024-05-05 DIAGNOSIS — R21 Rash and other nonspecific skin eruption: Secondary | ICD-10-CM | POA: Diagnosis present

## 2024-05-05 MED ORDER — TRIAMCINOLONE 0.1 % CREAM:EUCERIN CREAM 1:1
TOPICAL_CREAM | Freq: Three times a day (TID) | CUTANEOUS | Status: DC
  Filled 2024-05-05: qty 60
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
## 2024-05-05 NOTE — Discharge Instructions (Addendum)
 Please apply Kenalog  cream to bilateral feet 3 times a day.  Please follow-up with [place] [facility] for further care.  Return to ED with new symptoms.  Read attached guide concerning eczema.
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
## 2024-07-27 ENCOUNTER — Other Ambulatory Visit: Payer: Self-pay

## 2024-07-27 ENCOUNTER — Encounter (HOSPITAL_COMMUNITY): Payer: Self-pay | Admitting: Emergency Medicine

## 2024-07-27 ENCOUNTER — Emergency Department (HOSPITAL_COMMUNITY)
Admission: EM | Admit: 2024-07-27 | Discharge: 2024-07-27 | Disposition: A | Discharge status: Home / Self Care | Attending: Emergency Medicine | Admitting: Emergency Medicine

## 2024-07-27 ENCOUNTER — Emergency Department (HOSPITAL_COMMUNITY)

## 2024-07-27 DIAGNOSIS — S99922A Unspecified injury of left foot, initial encounter: Secondary | ICD-10-CM | POA: Diagnosis present

## 2024-07-27 DIAGNOSIS — S9032XA Contusion of left foot, initial encounter: Secondary | ICD-10-CM | POA: Diagnosis not present

## 2024-07-27 MED ORDER — ACETAMINOPHEN 325 MG PO TABS
650.0000 mg | ORAL_TABLET | Freq: Once | ORAL | Status: AC
  Administered 2024-07-27: 650 mg via ORAL
  Filled 2024-07-27: qty 2
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
## 2024-07-27 NOTE — Discharge Instructions (Addendum)
 There are no broken bones on your xray today. You likely have bruised your foot and may have sprained your ankle. You may use tylenol  or ibuprofen  as needed. Return to the ER with any new severe symptoms.
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
